# Patient Record
Sex: Female | Born: 1995
Health system: Southern US, Community
[De-identification: ages and names within clinical notes are randomized; demographics above are authoritative.]

## PROBLEM LIST (undated history)

## (undated) DIAGNOSIS — T7840XA Allergy, unspecified, initial encounter: Secondary | ICD-10-CM

## (undated) DIAGNOSIS — F988 Other specified behavioral and emotional disorders with onset usually occurring in childhood and adolescence: Secondary | ICD-10-CM

## (undated) DIAGNOSIS — J982 Interstitial emphysema: Secondary | ICD-10-CM

## (undated) DIAGNOSIS — J45909 Unspecified asthma, uncomplicated: Secondary | ICD-10-CM

## (undated) HISTORY — DX: Allergy, unspecified, initial encounter: T78.40XA

## (undated) HISTORY — DX: Interstitial emphysema: J98.2

## (undated) HISTORY — PX: TONSILLECTOMY: SUR1361

## (undated) HISTORY — DX: Other specified behavioral and emotional disorders with onset usually occurring in childhood and adolescence: F98.8

---
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
## 2015-02-09 ENCOUNTER — Telehealth: Payer: Self-pay | Admitting: Behavioral Health

## 2015-02-09 NOTE — Telephone Encounter (Signed)
Reached the patient for Pre-Visit Call, but then got disconnected. Attempted the number again and the phone line went to voicemail. Left message for patient to return call when available.

## 2015-02-10 ENCOUNTER — Other Ambulatory Visit: Payer: Self-pay | Admitting: Family

## 2015-02-10 ENCOUNTER — Encounter: Payer: Self-pay | Admitting: Family

## 2015-02-10 ENCOUNTER — Other Ambulatory Visit (HOSPITAL_COMMUNITY)
Admission: RE | Admit: 2015-02-10 | Discharge: 2015-02-10 | Disposition: A | Discharge status: Home / Self Care | Payer: 59 | Source: Ambulatory Visit | Attending: Family | Admitting: Family

## 2015-02-10 ENCOUNTER — Ambulatory Visit (HOSPITAL_BASED_OUTPATIENT_CLINIC_OR_DEPARTMENT_OTHER)
Admission: RE | Admit: 2015-02-10 | Discharge: 2015-02-10 | Disposition: A | Discharge status: Home / Self Care | Payer: 59 | Source: Ambulatory Visit | Attending: Family | Admitting: Family

## 2015-02-10 ENCOUNTER — Ambulatory Visit (INDEPENDENT_AMBULATORY_CARE_PROVIDER_SITE_OTHER): Payer: 59 | Admitting: Family

## 2015-02-10 VITALS — BP 123/67 | HR 95 | Temp 98.5°F | Resp 16 | Ht 67.25 in | Wt 127.6 lb

## 2015-02-10 DIAGNOSIS — F988 Other specified behavioral and emotional disorders with onset usually occurring in childhood and adolescence: Secondary | ICD-10-CM | POA: Insufficient documentation

## 2015-02-10 DIAGNOSIS — M954 Acquired deformity of chest and rib: Secondary | ICD-10-CM

## 2015-02-10 DIAGNOSIS — J45909 Unspecified asthma, uncomplicated: Secondary | ICD-10-CM | POA: Insufficient documentation

## 2015-02-10 DIAGNOSIS — M4185 Other forms of scoliosis, thoracolumbar region: Secondary | ICD-10-CM | POA: Diagnosis not present

## 2015-02-10 DIAGNOSIS — N760 Acute vaginitis: Secondary | ICD-10-CM | POA: Insufficient documentation

## 2015-02-10 DIAGNOSIS — J309 Allergic rhinitis, unspecified: Secondary | ICD-10-CM | POA: Insufficient documentation

## 2015-02-10 DIAGNOSIS — J4530 Mild persistent asthma, uncomplicated: Secondary | ICD-10-CM | POA: Diagnosis not present

## 2015-02-10 DIAGNOSIS — N898 Other specified noninflammatory disorders of vagina: Secondary | ICD-10-CM | POA: Diagnosis not present

## 2015-02-10 DIAGNOSIS — M419 Scoliosis, unspecified: Secondary | ICD-10-CM | POA: Insufficient documentation

## 2015-02-10 DIAGNOSIS — F909 Attention-deficit hyperactivity disorder, unspecified type: Secondary | ICD-10-CM

## 2015-02-10 DIAGNOSIS — Z113 Encounter for screening for infections with a predominantly sexual mode of transmission: Secondary | ICD-10-CM | POA: Insufficient documentation

## 2015-02-10 MED ORDER — ALBUTEROL SULFATE HFA 108 (90 BASE) MCG/ACT IN AERS: 2.0000 | INHALATION_SPRAY | Freq: Four times a day (QID) | RESPIRATORY_TRACT | Status: AC | PRN

## 2015-02-10 NOTE — Assessment & Plan Note (Signed)
X ray was performed today of her rib cage. Ribs appear normal. No breast masses noted.  Suspect that the prominent rib is due to asymmetry from her scoliosis noted today on x ray.

## 2015-02-10 NOTE — Progress Notes (Signed)
Subjective:    Patient ID: Sara Klein, female    DOB: [birth year], 19 y.o.   MRN: [id number]  HPI  Sara Klein is a 19 yr old female who presents today to establish care.    Pmhx is significant for:  ADD- This is prescribed by Psychiatry- Dr. Emmit Pomfret, started medications her senior year of high school. Reports that this is working well for her.    Asthma- Using Advair, reports well controlled not needing rescue inhaler often.  Seasonal allergies- worst in the fall.  Not using antihistamine.   She has the following concerns today:  1) sore spot on chest x 2 months. Denies injury.    2) Vaginal D/c-  Reports milky vaginal D/c with odor x 2 days.denies itching.  + sexually active.  Uses condoms She has never had a pap smear before.  Declines birth control.    Review of Systems  Constitutional: Negative for unexpected weight change.  HENT: Positive for rhinorrhea. Negative for hearing loss.   Eyes: Negative for visual disturbance.  Respiratory:       Reports "chest cold"  Cardiovascular: Negative for leg swelling.  Gastrointestinal: Negative for diarrhea and constipation.  Genitourinary: Negative for dysuria, frequency and menstrual problem.  Musculoskeletal: Negative for joint swelling and arthralgias.  Skin: Negative for rash.  Neurological: Negative for headaches.  Hematological: Negative for adenopathy.  Psychiatric/Behavioral:       Denies depression/anxiety   Past Medical History  Diagnosis Date  . ADD (attention deficit disorder)   . Allergy     seasonal  . Asthma   . Pneumomediastinum (HCC) 2013    Social History   Social History  . Marital Status: Single    Spouse Name: N/A  . Number of Children: N/A  . Years of Education: N/A   Occupational History  . Not on file.   Social History Main Topics  . Smoking status: Current Every Day Smoker -- 0.50 packs/day for 2 years    Types: Cigarettes  . Smokeless tobacco: Not on file  . Alcohol Use: 0.0  oz/week    0 Standard drinks or equivalent per week     Comment: rarely  . Drug Use: Yes     Comment: rarely  . Sexual Activity: Yes    Birth Control/ Protection: None   Other Topics Concern  . Not on file   Social History Narrative   Student at [employer] with mom   Has a younger brother.      Past Surgical History  Procedure Laterality Date  . Tonsillectomy      Family History  Problem Relation Age of Onset  . Gestational diabetes Mother   . Heart attack Father 45    deceased from drug related issues  . Cancer Maternal Aunt     breast  . Kidney disease Neg Hx     Allergies  Allergen Reactions  . Peanuts [Peanut Oil]     "sneezes"    Current Outpatient Prescriptions on File Prior to Visit  Medication Sig Dispense Refill  . Fluticasone-Salmeterol (ADVAIR) 100-50 MCG/DOSE AEPB Inhale 1 puff into the lungs every 12 (twelve) hours.    Marland Kitchen loratadine (CLARITIN) 10 MG tablet Take 10 mg by mouth daily as needed for allergies.      No current facility-administered medications on file prior to visit.    BP 123/67 mmHg  Pulse 95  Temp(Src) 98.5 F (36.9 C) (Oral)  Resp 16  Ht 5' 7.25" (1.708  m)  Wt 127 lb 9.6 oz (57.879 kg)  BMI 19.84 kg/m2  SpO2 100%  LMP 01/10/2015       Objective:   Physical Exam  Constitutional: She is oriented to person, place, and time. She appears well-developed and well-nourished.  HENT:  Head: Normocephalic and atraumatic.  Eyes: No scleral icterus.  Cardiovascular: Normal rate, regular rhythm and normal heart sounds.   No murmur heard. Pulmonary/Chest: Effort normal and breath sounds normal. No respiratory distress. She has no wheezes.  Genitourinary:  Breasts: Examined lying and sitting.  Right: Without masses, retractions, discharge or axillary adenopathy.  Left: Without masses, retractions, discharge or axillary adenopathy.  Inguinal/mons: Normal without inguinal adenopathy  External genitalia: Normal  BUS/Urethra/Skene's  glands: Normal  Bladder: Normal  Vagina: Normal  Cervix: Normal  Uterus: normal in size, shape and contour. Midline and mobile   Musculoskeletal:  Prominent rib noted on left anterior chest beneath clavicle  Lymphadenopathy:    She has no cervical adenopathy.  Neurological: She is alert and oriented to person, place, and time.  Skin: Skin is dry.  Psychiatric: She has a normal mood and affect. Her behavior is normal. Judgment and thought content normal.          Assessment & Plan:  Vaginal discharge- attempted Pap today, but pt was too uncomfortable with speculum exam.  Did obtain specimen to send off GC/Chlamydia and wet prep for further evaluation. Advised pt that when she is ready to try to repeat pap exam, she will need referral to GYN.

## 2015-02-10 NOTE — Assessment & Plan Note (Signed)
Uncontrolled. Restart claririn.

## 2015-02-10 NOTE — Assessment & Plan Note (Signed)
Stable on vyvanse.  Managed by psychiatry.

## 2015-02-10 NOTE — Assessment & Plan Note (Signed)
Stable on advair, continue same + prn albuterol.

## 2015-02-10 NOTE — Progress Notes (Signed)
Pre visit review using our clinic review tool, if applicable. No additional management support is needed unless otherwise documented below in the visit note. 

## 2015-02-10 NOTE — Patient Instructions (Addendum)
We will contact you with your results.   Add claritin 10mg  once daily. Schedule a complete physical at the front desk.  Welcome to [facility]!

## 2015-02-11 ENCOUNTER — Telehealth: Payer: Self-pay | Admitting: Family

## 2015-02-11 LAB — CERVICOVAGINAL ANCILLARY ONLY
Chlamydia: NEGATIVE
Neisseria Gonorrhea: NEGATIVE
WET PREP (BD AFFIRM): POSITIVE — AB

## 2015-02-11 MED ORDER — METRONIDAZOLE 500 MG PO TABS: 500.0000 mg | ORAL_TABLET | Freq: Two times a day (BID) | ORAL | Status: AC

## 2015-02-11 NOTE — Telephone Encounter (Signed)
Please let pt know that her wet prep is positive for garderella (BV). She should start metronidazole bid x 7 days, avoid alcohol while taking metronidazole.

## 2015-02-11 NOTE — Telephone Encounter (Signed)
Notified pt and she voices understanding. 

## 2015-03-18 ENCOUNTER — Ambulatory Visit (INDEPENDENT_AMBULATORY_CARE_PROVIDER_SITE_OTHER): Payer: 59 | Admitting: Family

## 2015-03-18 ENCOUNTER — Encounter: Payer: Self-pay | Admitting: Family

## 2015-03-18 VITALS — BP 125/69 | HR 84 | Temp 98.3°F | Resp 16 | Ht 67.25 in | Wt 128.6 lb

## 2015-03-18 DIAGNOSIS — Z0000 Encounter for general adult medical examination without abnormal findings: Secondary | ICD-10-CM

## 2015-03-18 LAB — CBC WITH DIFFERENTIAL/PLATELET
Basophils Absolute: 0 10*3/uL (ref 0.0–0.1)
Basophils Relative: 0.3 % (ref 0.0–3.0)
EOS PCT: 5 % (ref 0.0–5.0)
Eosinophils Absolute: 0.4 10*3/uL (ref 0.0–0.7)
HCT: 39.6 % (ref 36.0–46.0)
Hemoglobin: 13.5 g/dL (ref 12.0–15.0)
LYMPHS ABS: 0.9 10*3/uL (ref 0.7–4.0)
Lymphocytes Relative: 11.9 % — ABNORMAL LOW (ref 12.0–46.0)
MCHC: 34.1 g/dL (ref 30.0–36.0)
MCV: 90.3 fl (ref 78.0–100.0)
MONO ABS: 0.4 10*3/uL (ref 0.1–1.0)
Monocytes Relative: 5.7 % (ref 3.0–12.0)
NEUTROS ABS: 6.1 10*3/uL (ref 1.4–7.7)
NEUTROS PCT: 77.1 % — AB (ref 43.0–77.0)
PLATELETS: 286 10*3/uL (ref 150.0–400.0)
RBC: 4.39 Mil/uL (ref 3.87–5.11)
RDW: 12.9 % (ref 11.5–15.5)
WBC: 7.9 10*3/uL (ref 4.0–10.5)

## 2015-03-18 LAB — BASIC METABOLIC PANEL
BUN: 12 mg/dL (ref 6–23)
CO2: 25 meq/L (ref 19–32)
Calcium: 9.5 mg/dL (ref 8.4–10.5)
Chloride: 106 mEq/L (ref 96–112)
Creatinine, Ser: 0.7 mg/dL (ref 0.40–1.20)
GFR: 111.84 mL/min (ref >60.00)
GLUCOSE: 86 mg/dL (ref 70–99)
POTASSIUM: 3.9 meq/L (ref 3.5–5.1)
Sodium: 139 mEq/L (ref 135–145)

## 2015-03-18 LAB — LIPID PANEL
CHOL/HDL RATIO: 3
Cholesterol: 159 mg/dL (ref 0–200)
HDL: 60.6 mg/dL (ref >39.00)
LDL Cholesterol: 87 mg/dL (ref 0–99)
NONHDL: 97.95
Triglycerides: 57 mg/dL (ref 0.0–149.0)
VLDL: 11.4 mg/dL (ref 0.0–40.0)

## 2015-03-18 LAB — HEPATIC FUNCTION PANEL
ALBUMIN: 4.7 g/dL (ref 3.5–5.2)
ALT: 14 U/L (ref 0–35)
AST: 15 U/L (ref 0–37)
Alkaline Phosphatase: 47 U/L (ref 39–117)
BILIRUBIN TOTAL: 0.4 mg/dL (ref 0.2–1.2)
Bilirubin, Direct: 0.1 mg/dL (ref 0.0–0.3)
Total Protein: 7.2 g/dL (ref 6.0–8.3)

## 2015-03-18 LAB — URINALYSIS, ROUTINE W REFLEX MICROSCOPIC
Bilirubin Urine: NEGATIVE
KETONES UR: NEGATIVE
LEUKOCYTES UA: NEGATIVE
NITRITE: NEGATIVE
Specific Gravity, Urine: <=1.005 — AB (ref 1.000–1.030)
TOTAL PROTEIN, URINE-UPE24: NEGATIVE
URINE GLUCOSE: NEGATIVE
UROBILINOGEN UA: 0.2 (ref 0.0–1.0)
WBC UA: NONE SEEN (ref 0–?)
pH: 5.5 (ref 5.0–8.0)

## 2015-03-18 LAB — TSH: TSH: 0.79 u[IU]/mL (ref 0.35–4.50)

## 2015-03-18 NOTE — Progress Notes (Signed)
Subjective:    Patient ID: Sara Klein, female    DOB: 07-04-1995, 19 y.o.   MRN: [id number]  HPI  Patient presents today for complete physical.  Immunizations: declines flu shot Diet: reports diet is fair Exercise: some Pap Smear: would like referral Dental:  Due Eye: due  Review of Systems  Constitutional: Negative for unexpected weight change.  HENT: Negative for hearing loss.   Eyes: Negative for visual disturbance.  Respiratory: Negative for cough.   Cardiovascular: Negative for leg swelling.  Gastrointestinal: Negative for nausea, diarrhea and constipation.  Genitourinary: Negative for dysuria, frequency and menstrual problem.  Musculoskeletal: Negative for myalgias and arthralgias.       Mild knee pain-    Skin: Negative for rash.  Neurological: Negative for headaches.  Hematological: Negative for adenopathy.  Psychiatric/Behavioral:       Denies depression/anxiety   Past Medical History  Diagnosis Date  . ADD (attention deficit disorder)   . Allergy     seasonal  . Asthma   . Pneumomediastinum (HCC) 2013    Social History   Social History  . Marital Status: Single    Spouse Name: N/A  . Number of Children: N/A  . Years of Education: N/A   Occupational History  . Not on file.   Social History Main Topics  . Smoking status: Current Every Day Smoker -- 0.50 packs/day for 2 years    Types: Cigarettes  . Smokeless tobacco: Not on file  . Alcohol Use: 0.0 oz/week    0 Standard drinks or equivalent per week     Comment: rarely  . Drug Use: Yes     Comment: rarely  . Sexual Activity: Yes    Birth Control/ Protection: None   Other Topics Concern  . Not on file   Social History Narrative   Student at [employer]   Lives with mom   Has a younger brother.      Past Surgical History  Procedure Laterality Date  . Tonsillectomy      Family History  Problem Relation Age of Onset  . Gestational diabetes Mother   . Heart attack Father 45   deceased from drug related issues  . Cancer Maternal Aunt     breast  . Kidney disease Neg Hx     Allergies  Allergen Reactions  . Peanuts [Peanut Oil]     "sneezes"    Current Outpatient Prescriptions on File Prior to Visit  Medication Sig Dispense Refill  . albuterol (PROVENTIL HFA;VENTOLIN HFA) 108 (90 BASE) MCG/ACT inhaler Inhale 2 puffs into the lungs every 6 (six) hours as needed for wheezing or shortness of breath. 1 Inhaler 0  . Fluticasone-Salmeterol (ADVAIR) 100-50 MCG/DOSE AEPB Inhale 1 puff into the lungs every 12 (twelve) hours.    Marland Kitchen. lisdexamfetamine (VYVANSE) 30 MG capsule Take 30 mg by mouth daily.    Marland Kitchen. loratadine (CLARITIN) 10 MG tablet Take 10 mg by mouth daily as needed for allergies.      No current facility-administered medications on file prior to visit.    BP 125/69 mmHg  Pulse 84  Temp(Src) 98.3 F (36.8 C) (Oral)  Resp 16  Ht 5' 7.25" (1.708 m)  Wt 128 lb 9.6 oz (58.333 kg)  BMI 20.00 kg/m2  SpO2 100%  LMP 03/18/2015       Objective:   Physical Exam   Physical Exam  Constitutional: She is oriented to person, place, and time. She appears well-developed and well-nourished. No distress.  HENT:  Head: Normocephalic and atraumatic.  Right Ear: Tympanic membrane and ear canal normal.  Left Ear: Tympanic membrane and ear canal normal.  Mouth/Throat: Oropharynx is clear and moist.  Eyes: Pupils are equal, round, and reactive to light. No scleral icterus.  Neck: Normal range of motion. No thyromegaly present.  Cardiovascular: Normal rate and regular rhythm.   No murmur heard. Pulmonary/Chest: Effort normal and breath sounds normal. No respiratory distress. He has no wheezes. She has no rales. She exhibits no tenderness.  Abdominal: Soft. Bowel sounds are normal. He exhibits no distension and no mass. There is no tenderness. There is no rebound and no guarding.  Musculoskeletal: She exhibits no edema.  Lymphadenopathy:    She has no cervical  adenopathy.  Neurological: She is alert and oriented to person, place, and time. She has normal reflexes. She exhibits normal muscle tone. Coordination normal.  Skin: Skin is warm and dry.  Psychiatric: She has a normal mood and affect. Her behavior is normal. Judgment and thought content normal.  Breast/pelvic: deferred to GYN       Assessment & Plan:        Assessment & Plan:

## 2015-03-18 NOTE — Progress Notes (Signed)
Pre visit review using our clinic review tool, if applicable. No additional management support is needed unless otherwise documented below in the visit note. 

## 2015-03-18 NOTE — Patient Instructions (Signed)
Please complete lab work prior to leaving.   Continue to work on healthy diet and exercise.  

## 2015-03-19 ENCOUNTER — Encounter: Payer: Self-pay | Admitting: Family

## 2015-03-19 DIAGNOSIS — Z0000 Encounter for general adult medical examination without abnormal findings: Secondary | ICD-10-CM | POA: Insufficient documentation

## 2015-03-19 NOTE — Assessment & Plan Note (Signed)
Discussed healthy diet, exercise.  Refer to GYN for pap. Advised pt to schedule routine dental care and eye exam. Obtain routine lab work.  Call if knee pain worsens or does not improve.

## 2015-05-01 MED FILL — ADVAIR 100/50 DISKUS: 100-50 | 30 days supply | Qty: 60 | Fill #2

## 2015-05-07 MED FILL — VYVANSE 30 MG CAPSULE: 30 | 30 days supply | Qty: 30 | Fill #0

## 2015-06-01 MED FILL — ADVAIR 100/50 DISKUS: 100-50 | 30 days supply | Qty: 60 | Fill #3

## 2015-06-08 ENCOUNTER — Telehealth: Payer: Self-pay | Admitting: Family

## 2015-06-08 MED FILL — VYVANSE 30 MG CAPSULE: 30 | 30 days supply | Qty: 30 | Fill #0

## 2015-06-08 NOTE — Telephone Encounter (Signed)
PATIENT IS A [employer] EMPLOYEE AND SHE HAD HER SHOT IN September

## 2015-06-08 NOTE — Telephone Encounter (Signed)
Health maintenance has been updated.

## 2015-07-02 MED FILL — ADVAIR 100/50 DISKUS: 100-50 | 30 days supply | Qty: 60 | Fill #4

## 2015-07-08 DIAGNOSIS — F900 Attention-deficit hyperactivity disorder, predominantly inattentive type: Secondary | ICD-10-CM | POA: Diagnosis not present

## 2015-07-09 MED FILL — VYVANSE 40 MG CAPSULE: 40 | 30 days supply | Qty: 30 | Fill #0

## 2015-08-03 MED FILL — ADVAIR 100/50 DISKUS: 100-50 | 30 days supply | Qty: 60 | Fill #0

## 2015-08-07 MED FILL — VYVANSE 40 MG CAPSULE: 40 | 30 days supply | Qty: 30 | Fill #0

## 2015-09-02 MED FILL — ADVAIR 100/50 DISKUS: 100-50 | 30 days supply | Qty: 60 | Fill #1

## 2015-09-22 MED FILL — ADVAIR 100/50 DISKUS: 100-50 | 30 days supply | Qty: 60 | Fill #0

## 2015-10-26 MED FILL — VYVANSE 40 MG CAPSULE: 40 | 30 days supply | Qty: 30 | Fill #0

## 2015-11-02 MED FILL — ADVAIR 100/50 DISKUS: 100-50 | 30 days supply | Qty: 60 | Fill #1

## 2015-11-26 MED FILL — VYVANSE 30 MG CAPSULE: 30 | 30 days supply | Qty: 30 | Fill #0

## 2015-12-03 MED FILL — ADVAIR 100/50 DISKUS: 100-50 | 30 days supply | Qty: 60 | Fill #2

## 2015-12-25 MED FILL — VYVANSE 30 MG CAPSULE: 30 | 30 days supply | Qty: 30 | Fill #0

## 2016-01-04 MED FILL — ADVAIR 100/50 DISKUS: 100-50 | 30 days supply | Qty: 60 | Fill #3

## 2016-01-27 MED FILL — VYVANSE 30 MG CAPSULE: 30 | 30 days supply | Qty: 30 | Fill #0

## 2016-01-28 ENCOUNTER — Ambulatory Visit (INDEPENDENT_AMBULATORY_CARE_PROVIDER_SITE_OTHER): Payer: 59 | Admitting: Medical

## 2016-01-28 ENCOUNTER — Encounter: Payer: Self-pay | Admitting: Medical

## 2016-01-28 ENCOUNTER — Other Ambulatory Visit (HOSPITAL_COMMUNITY)
Admission: RE | Admit: 2016-01-28 | Discharge: 2016-01-28 | Disposition: A | Discharge status: Home / Self Care | Payer: 59 | Source: Ambulatory Visit | Attending: Medical | Admitting: Medical

## 2016-01-28 VITALS — BP 114/74 | HR 64 | Temp 98.0°F | Ht 67.25 in | Wt 131.4 lb

## 2016-01-28 DIAGNOSIS — L298 Other pruritus: Secondary | ICD-10-CM

## 2016-01-28 DIAGNOSIS — N898 Other specified noninflammatory disorders of vagina: Secondary | ICD-10-CM | POA: Diagnosis not present

## 2016-01-28 DIAGNOSIS — Z113 Encounter for screening for infections with a predominantly sexual mode of transmission: Secondary | ICD-10-CM | POA: Diagnosis not present

## 2016-01-28 DIAGNOSIS — N760 Acute vaginitis: Secondary | ICD-10-CM | POA: Diagnosis not present

## 2016-01-28 LAB — POC URINALSYSI DIPSTICK (AUTOMATED)
Bilirubin, UA: NEGATIVE
GLUCOSE UA: NEGATIVE
Ketones, UA: NEGATIVE
LEUKOCYTES UA: NEGATIVE
NITRITE UA: NEGATIVE
PH UA: 6
Protein, UA: NEGATIVE
Spec Grav, UA: 1.015
UROBILINOGEN UA: 0.2

## 2016-01-28 MED ORDER — FLUCONAZOLE 150 MG PO TABS: ORAL_TABLET | ORAL | 0 refills | Status: DC

## 2016-01-28 MED FILL — FLUCONAZOLE 150 MG TABLET: 150 | 1 days supply | Qty: 1 | Fill #0

## 2016-01-28 NOTE — Patient Instructions (Signed)
Your symptoms of vaginal itch and white discharge recently appear most consistent with yeast infection.   I am writing rx of diflucan with refill if needed pending the urine culture and ancillary studies(which will include bacterial vaginosis).  Follow up in 7 days or as needed

## 2016-01-28 NOTE — Progress Notes (Signed)
Pre visit review using our clinic review tool, if applicable. No additional management support is needed unless otherwise documented below in the visit note. 

## 2016-01-28 NOTE — Progress Notes (Signed)
Subjective:    Patient ID: Sara Klein, female    DOB: 10-26-1995, 20 y.o.   MRN: [id number]  HPI  Pt in for what she states is likely yeast infection.  Pt states on Saturday she had symptoms. .She stated itching discomfort and vaginal itch. She had whitish discharge. She states Saturday  used otc monistat or equivalent and  states she felt better. Then this morning some faint itching again. No vaginal odor reported.  No recent antibiotics. Pt states hx of occasional yeast infection. But also states hx of bv one time.   LMP- October 5th, 2017. Normal cycle and came when she expected it to.  No burning or frequent urination. See GU ROS.   Review of Systems  Constitutional: Negative for chills, fatigue and fever.  HENT: Negative for congestion.   Respiratory: Negative for cough, chest tightness, shortness of breath and wheezing.   Cardiovascular: Negative for chest pain and palpitations.  Gastrointestinal: Negative for abdominal pain, blood in stool, constipation, nausea and vomiting.  Genitourinary: Negative for difficulty urinating, dysuria, frequency, pelvic pain, urgency, vaginal bleeding, vaginal discharge and vaginal pain.       Vaginal itch. White dc. See hpi.  Musculoskeletal: Negative for back pain.  Psychiatric/Behavioral: Negative for behavioral problems and confusion.   Past Medical History:  Diagnosis Date  . ADD (attention deficit disorder)   . Allergy    seasonal  . Asthma   . Pneumomediastinum ([facility]) 2013     Social History   Social History  . Marital status: Single    Spouse name: N/A  . Number of children: N/A  . Years of education: N/A   Occupational History  . Not on file.   Social History Main Topics  . Smoking status: Current Every Day Smoker    Packs/day: 0.50    Years: 2.00    Types: Cigarettes  . Smokeless tobacco: Not on file  . Alcohol use 0.0 oz/week     Comment: rarely  . Drug use:      Comment: rarely  . Sexual activity: Yes      Birth control/ protection: None   Other Topics Concern  . Not on file   Social History Narrative   Student at [employer]   Lives with mom   Has a younger brother.      Past Surgical History:  Procedure Laterality Date  . TONSILLECTOMY      Family History  Problem Relation Age of Onset  . Gestational diabetes Mother   . Heart attack Father 45    deceased from drug related issues  . Cancer Maternal Aunt     breast  . Kidney disease Neg Hx     Allergies  Allergen Reactions  . Peanuts [Peanut Oil]     "sneezes"    Current Outpatient Prescriptions on File Prior to Visit  Medication Sig Dispense Refill  . albuterol (PROVENTIL HFA;VENTOLIN HFA) 108 (90 BASE) MCG/ACT inhaler Inhale 2 puffs into the lungs every 6 (six) hours as needed for wheezing or shortness of breath. 1 Inhaler 0  . Fluticasone-Salmeterol (ADVAIR) 100-50 MCG/DOSE AEPB Inhale 1 puff into the lungs every 12 (twelve) hours.    Marland Kitchen. lisdexamfetamine (VYVANSE) 30 MG capsule Take 30 mg by mouth daily.    Marland Kitchen. loratadine (CLARITIN) 10 MG tablet Take 10 mg by mouth daily as needed for allergies.      No current facility-administered medications on file prior to visit.     BP 114/74  Pulse 64   Temp 98 F (36.7 C) (Oral)   Ht 5' 7.25" (1.708 m)   Wt 131 lb 6.4 oz (59.6 kg)   LMP 01/16/2016   SpO2 95%   BMI 20.43 kg/m       Objective:   Physical Exam   General Appearance- Not in acute distress.  HEENT Eyes- Scleraeral/Conjuntiva-bilat- Not Yellow. Mouth & Throat- Normal.  Chest and Lung Exam Auscultation: Breath sounds:-Normal. Adventitious sounds:- No Adventitious sounds.  Cardiovascular Auscultation:Rythm - Regular. Heart Sounds -Normal heart sounds.  Abdomen Inspection:-Inspection Normal.  Palpation/Perucssion: Palpation and Percussion of the abdomen reveal- Non Tender suprapubic area, No Rebound tenderness, No rigidity(Guarding) and No Palpable abdominal masses.  Liver:-Normal.  Spleen:-  Normal.    Back -no cva tenderness.     Assessment & Plan:  Your symptoms of vaginal itch and white discharge recently appear most consistent with yeast infection.   I am writing rx of diflucan with refill if needed pending the urine culture and ancillary studies(which will include bacterial vaginosis).  Follow up in 7 days or as needed   Elzora Cullins, Ramon Dredge, [employer]

## 2016-01-29 LAB — URINE CULTURE

## 2016-02-01 LAB — URINE CYTOLOGY ANCILLARY ONLY
CHLAMYDIA, DNA PROBE: NEGATIVE
NEISSERIA GONORRHEA: NEGATIVE
Trichomonas: NEGATIVE

## 2016-02-03 LAB — URINE CYTOLOGY ANCILLARY ONLY
BACTERIAL VAGINITIS: POSITIVE — AB
CANDIDA VAGINITIS: NEGATIVE

## 2016-02-03 MED ORDER — METRONIDAZOLE 500 MG PO TABS: 500.0000 mg | ORAL_TABLET | Freq: Two times a day (BID) | ORAL | 0 refills | Status: AC

## 2016-02-03 MED FILL — ADVAIR 100/50 DISKUS: 100-50 | 30 days supply | Qty: 60 | Fill #4

## 2016-02-03 NOTE — Progress Notes (Signed)
Pt has seen results on MyChart and message also sent for patient to call back if any questions.

## 2016-02-03 NOTE — Telephone Encounter (Signed)
Rx of flagyl sent to her pharmacy. Urine study came back + for bv.

## 2016-02-04 MED FILL — metroNIDAZOLE 500 MG TABS: 500 | 7 days supply | Qty: 14 | Fill #0

## 2016-02-04 NOTE — Progress Notes (Signed)
Pt has seen results on MyChart and message also sent for patient to call back if any questions.

## 2016-02-04 NOTE — Telephone Encounter (Signed)
Pt notified and made aware.  She had a question about what medication was and how long she should take it. Pt was informed that medication is antibiotic that she is to take twice a day for 7 days.  She stated understanding and is in agreement with plan.  No additional questions at this time.

## 2016-02-04 NOTE — Telephone Encounter (Signed)
Called pt and advised to take flagyl. She expressed understanding and started it tonight.

## 2016-03-02 MED FILL — ADVAIR 100/50 DISKUS: 100-50 | 30 days supply | Qty: 60 | Fill #5

## 2016-03-09 MED FILL — VYVANSE 30 MG CAPSULE: 30 | 90 days supply | Qty: 90 | Fill #0

## 2016-03-18 DIAGNOSIS — L209 Atopic dermatitis, unspecified: Secondary | ICD-10-CM | POA: Diagnosis not present

## 2016-03-18 DIAGNOSIS — J4540 Moderate persistent asthma, uncomplicated: Secondary | ICD-10-CM | POA: Diagnosis not present

## 2016-03-18 DIAGNOSIS — J3089 Other allergic rhinitis: Secondary | ICD-10-CM | POA: Diagnosis not present

## 2016-03-18 DIAGNOSIS — J3081 Allergic rhinitis due to animal (cat) (dog) hair and dander: Secondary | ICD-10-CM | POA: Diagnosis not present

## 2016-03-18 DIAGNOSIS — J301 Allergic rhinitis due to pollen: Secondary | ICD-10-CM | POA: Diagnosis not present

## 2016-03-18 MED FILL — TRIAMCINOLONE 0.1% OINTMENT: 0.1 | 30 days supply | Qty: 60 | Fill #0

## 2016-04-08 MED FILL — ADVAIR 100/50 DISKUS: 100-50 | 30 days supply | Qty: 60 | Fill #0

## 2016-05-10 MED FILL — ADVAIR 100/50 DISKUS: 100-50 | 30 days supply | Qty: 60 | Fill #1

## 2016-06-07 MED FILL — VYVANSE 30 MG CAPSULE: 30 | 30 days supply | Qty: 30 | Fill #0

## 2016-06-09 MED FILL — ADVAIR 100/50 DISKUS: 100-50 | 30 days supply | Qty: 60 | Fill #2

## 2016-07-07 MED FILL — VYVANSE 30 MG CAPSULE: 30 | 30 days supply | Qty: 30 | Fill #0

## 2016-07-07 MED FILL — ADVAIR 100/50 DISKUS: 100-50 | 30 days supply | Qty: 60 | Fill #3

## 2016-08-08 MED FILL — VYVANSE 30 MG CAPSULE: 30 | 30 days supply | Qty: 30 | Fill #0

## 2016-08-08 MED FILL — ADVAIR 100/50 DISKUS: 100-50 | 30 days supply | Qty: 60 | Fill #4

## 2016-09-08 MED FILL — ADVAIR 100/50 DISKUS: 100-50 | 30 days supply | Qty: 60 | Fill #5

## 2016-09-13 MED FILL — VYVANSE 30 MG CAPSULE: 30 | 30 days supply | Qty: 30 | Fill #0

## 2016-09-23 IMAGING — DX DG RIBS W/ CHEST 3+V BILAT
5 series · 5 of 5 positions shown · non-contrast
Comparison: Chest x-ray of March 23, 2012

CLINICAL DATA: Mid to upper anterior rib prominence discovered
several days ago. No history of injury, no pain.

EXAM:
BILATERAL RIBS AND CHEST - 4+ VIEW

[chest pa]
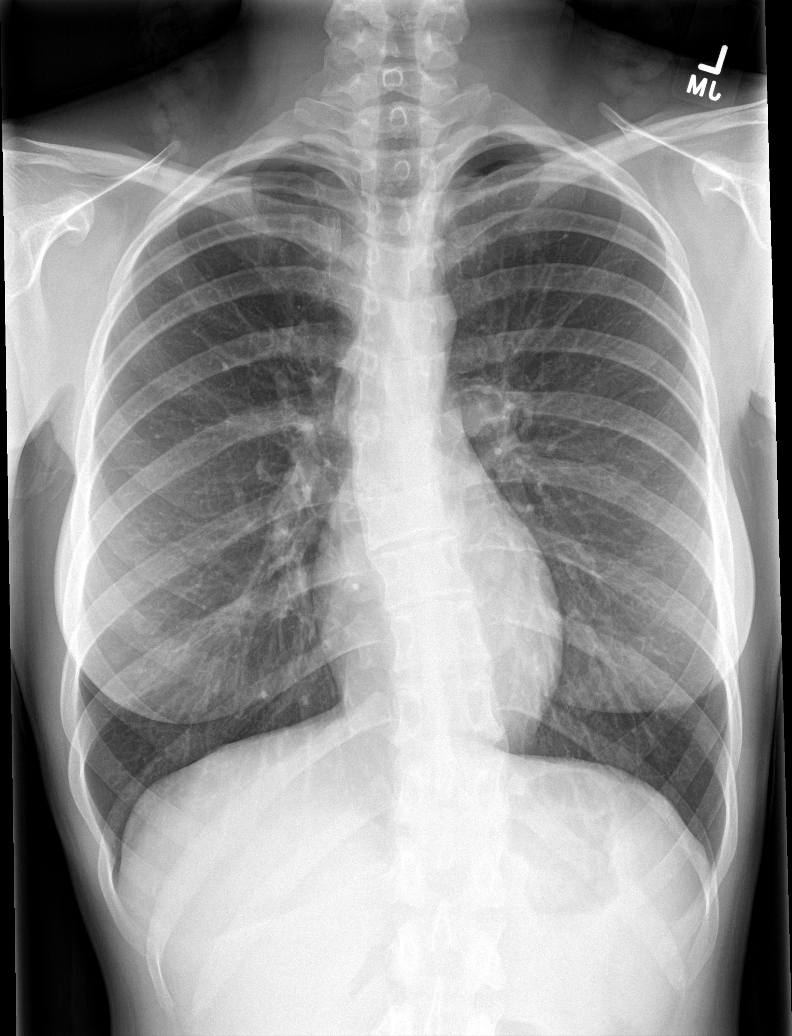

[rib pa (1 of 2)]
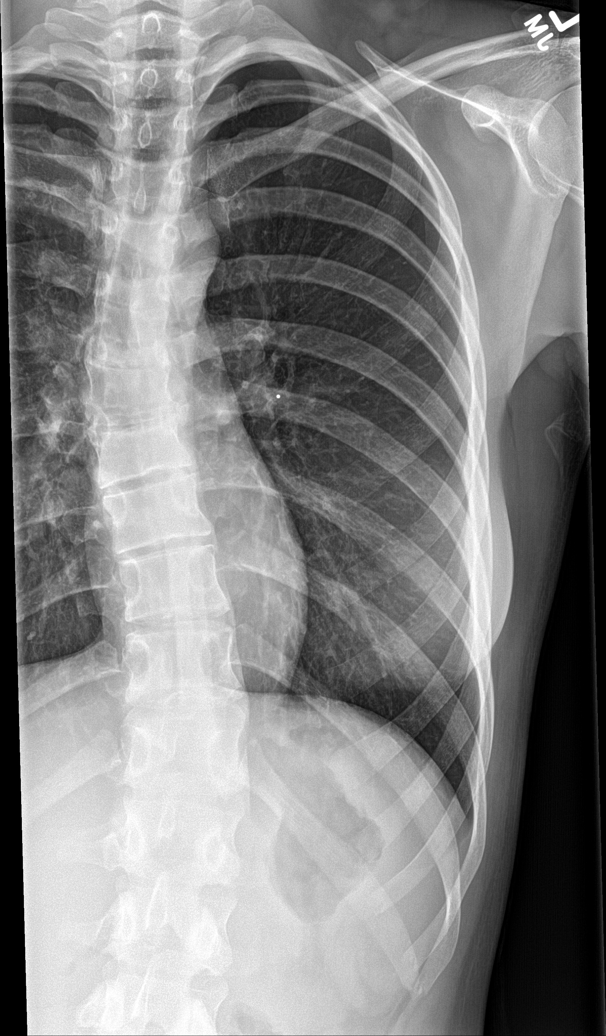

[rib pa (2 of 2)]
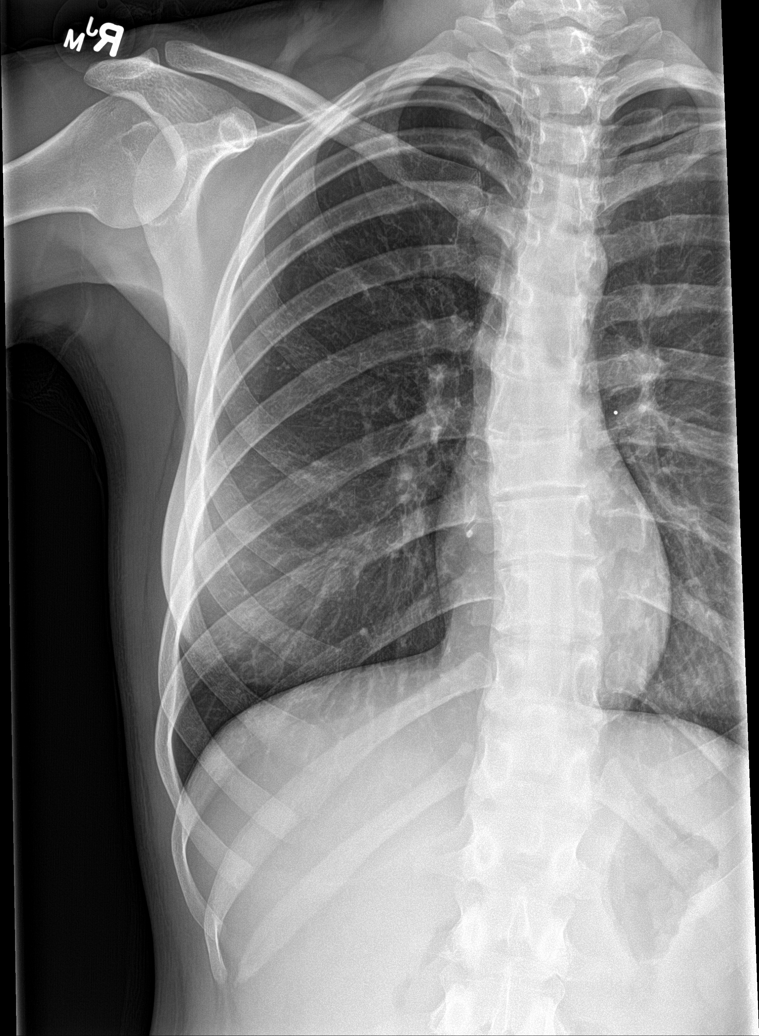

[rib pa obl (1 of 2)]
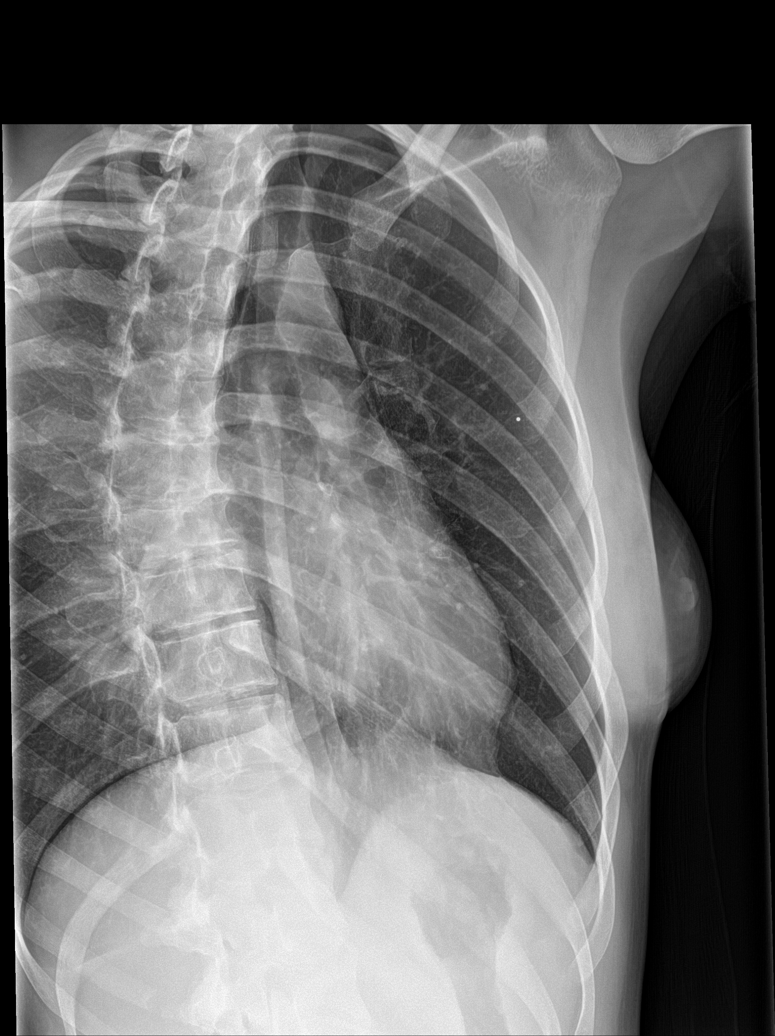

[rib pa obl (2 of 2)]
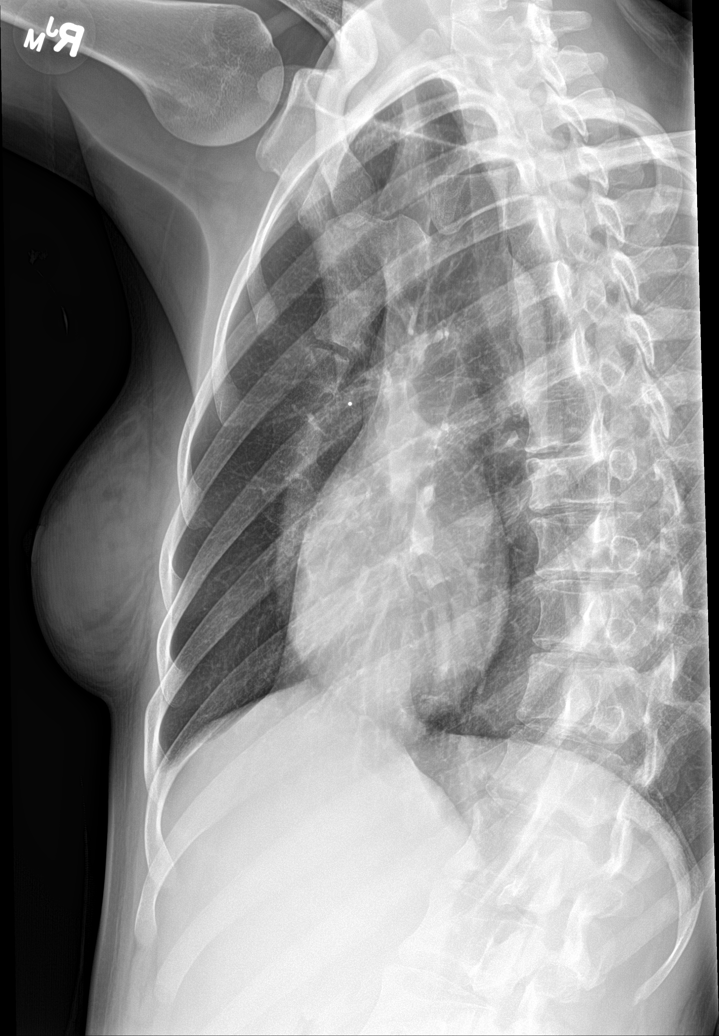

[5 of 5 positions shown; findings below may reference images not displayed]

FINDINGS: The lungs are well-expanded. There is no focal infiltrate the heart
and pulmonary vascularity are normal. The mediastinum is normal in
width. There is no pneumothorax or pneumomediastinum. There is
gentle S shaped thoracolumbar scoliosis.

Left rib images reveal the bones to be normal in contour and
adequately mineralized. No acute or healing fracture is observed.
IMPRESSION: 1. No acute abnormality of the ribs is demonstrated. There is
moderate S shaped thoracolumbar scoliosis. This is not a new finding
when compared to the previous study.
2. There is no active cardiopulmonary disease.

## 2016-10-10 MED FILL — ADVAIR 100/50 DISKUS: 100-50 | 30 days supply | Qty: 60 | Fill #0

## 2016-10-26 MED FILL — VYVANSE 30 MG CAPSULE: 30 | 30 days supply | Qty: 30 | Fill #0

## 2016-11-14 MED FILL — ADVAIR 100/50 DISKUS: 100-50 | 30 days supply | Qty: 60 | Fill #1

## 2016-11-25 MED FILL — VYVANSE 30 MG CAPSULE: 30 | 30 days supply | Qty: 30 | Fill #0

## 2016-12-19 MED FILL — ADVAIR 100/50 DISKUS: 100-50 | 30 days supply | Qty: 60 | Fill #2

## 2017-01-04 MED FILL — VYVANSE 30 MG CAPSULE: 30 | 30 days supply | Qty: 30 | Fill #0

## 2017-01-19 MED FILL — ADVAIR 100/50 DISKUS: 100-50 | 30 days supply | Qty: 60 | Fill #0

## 2017-02-09 MED FILL — VYVANSE 30 MG CAPSULE: 30 | 30 days supply | Qty: 30 | Fill #0

## 2017-02-20 MED FILL — ADVAIR 100/50 DISKUS: 100-50 | 30 days supply | Qty: 60 | Fill #0

## 2017-03-13 MED FILL — VYVANSE 30 MG CAPSULE: 30 | 30 days supply | Qty: 30 | Fill #0

## 2017-03-15 DIAGNOSIS — J4540 Moderate persistent asthma, uncomplicated: Secondary | ICD-10-CM | POA: Diagnosis not present

## 2017-03-15 DIAGNOSIS — L209 Atopic dermatitis, unspecified: Secondary | ICD-10-CM | POA: Diagnosis not present

## 2017-03-15 DIAGNOSIS — J301 Allergic rhinitis due to pollen: Secondary | ICD-10-CM | POA: Diagnosis not present

## 2017-03-15 DIAGNOSIS — J3081 Allergic rhinitis due to animal (cat) (dog) hair and dander: Secondary | ICD-10-CM | POA: Diagnosis not present

## 2017-03-15 MED FILL — VENTOLIN HFA 90 MCG INHALER: 108 (90 BAS | 16 days supply | Qty: 18 | Fill #0

## 2017-03-16 MED FILL — ADVAIR 100/50 DISKUS: 100-50 | 30 days supply | Qty: 60 | Fill #0

## 2017-04-13 MED FILL — VYVANSE 30 MG CAPSULE: 30 | 30 days supply | Qty: 30 | Fill #0

## 2017-04-24 MED FILL — ADVAIR 100/50 DISKUS: 100-50 | 30 days supply | Qty: 60 | Fill #1

## 2017-05-12 MED FILL — VYVANSE 30 MG CAPSULE: 30 | 30 days supply | Qty: 30 | Fill #0

## 2017-05-26 MED FILL — ADVAIR 100/50 DISKUS: 100-50 | 30 days supply | Qty: 60 | Fill #2

## 2017-06-15 MED FILL — VYVANSE 30 MG CAPSULE: 30 | 30 days supply | Qty: 30 | Fill #0

## 2017-06-30 MED FILL — ADVAIR 100/50 DISKUS: 100-50 | 30 days supply | Qty: 60 | Fill #3

## 2017-07-13 MED FILL — VYVANSE 30 MG CAPSULE: 30 | 30 days supply | Qty: 30 | Fill #0

## 2017-07-27 MED FILL — ADVAIR 100/50 DISKUS: 100-50 | 30 days supply | Qty: 60 | Fill #4

## 2017-08-11 MED FILL — VYVANSE 30 MG CAPSULE: 30 | 30 days supply | Qty: 30 | Fill #0

## 2017-08-30 MED FILL — ADVAIR 100/50 DISKUS: 100-50 | 30 days supply | Qty: 60 | Fill #5

## 2017-09-08 MED FILL — VYVANSE 30 MG CAPSULE: 30 | 30 days supply | Qty: 30 | Fill #0

## 2017-09-11 ENCOUNTER — Telehealth: Payer: 59 | Admitting: Family

## 2017-09-11 DIAGNOSIS — B373 Candidiasis of vulva and vagina: Secondary | ICD-10-CM | POA: Diagnosis not present

## 2017-09-11 DIAGNOSIS — B3731 Acute candidiasis of vulva and vagina: Secondary | ICD-10-CM

## 2017-09-11 MED ORDER — FLUCONAZOLE 150 MG PO TABS: 150.0000 mg | ORAL_TABLET | Freq: Once | ORAL | 0 refills | Status: AC

## 2017-09-11 MED FILL — FLUCONAZOLE 150 MG TABS: 150 | 1 days supply | Qty: 1 | Fill #0

## 2017-09-11 NOTE — Progress Notes (Signed)
Thank you for the details you included in the comment boxes. Those details are very helpful in determining the best course of treatment for you and help us to provide the best care. It is possible that there is more yeast colonized and that medication would take care of the rest. I am sending plan as below.  We are sorry that you are not feeling well. Here is how we plan to help! Based on what you shared with me it looks like you: May have a yeast vaginosis  Vaginosis is an inflammation of the vagina that can result in discharge, itching and pain. The cause is usually a change in the normal balance of vaginal bacteria or an infection. Vaginosis can also result from reduced estrogen levels after menopause.  The most common causes of vaginosis are:   Bacterial vaginosis which results from an overgrowth of one on several organisms that are normally present in your vagina.   Yeast infections which are caused by a naturally occurring fungus called candida.   Vaginal atrophy (atrophic vaginosis) which results from the thinning of the vagina from reduced estrogen levels after menopause.   Trichomoniasis which is caused by a parasite and is commonly transmitted by sexual intercourse.  Factors that increase your risk of developing vaginosis include: Marland Kitchen. Medications, such as antibiotics and steroids . Uncontrolled diabetes . Use of hygiene products such as bubble bath, vaginal spray or vaginal deodorant . Douching . Wearing damp or tight-fitting clothing . Using an intrauterine device (IUD) for birth control . Hormonal changes, such as those associated with pregnancy, birth control pills or menopause . Sexual activity . Having a sexually transmitted infection  Your treatment plan is A single Diflucan (fluconazole) 150mg  tablet once.  I have electronically sent this prescription into the pharmacy that you have chosen.  Be sure to take all of the medication as directed. Stop taking any medication if  you develop a rash, tongue swelling or shortness of breath. Mothers who are breast feeding should consider pumping and discarding their breast milk while on these antibiotics. However, there is no consensus that infant exposure at these doses would be harmful.  Remember that medication creams can weaken latex condoms. Marland Kitchen.   HOME CARE:  Good hygiene may prevent some types of vaginosis from recurring and may relieve some symptoms:  . Avoid baths, hot tubs and whirlpool spas. Rinse soap from your outer genital area after a shower, and dry the area well to prevent irritation. Don't use scented or harsh soaps, such as those with deodorant or antibacterial action. Marland Kitchen. Avoid irritants. These include scented tampons and pads. . Wipe from front to back after using the toilet. Doing so avoids spreading fecal bacteria to your vagina.  Other things that may help prevent vaginosis include:  Marland Kitchen. Don't douche. Your vagina doesn't require cleansing other than normal bathing. Repetitive douching disrupts the normal organisms that reside in the vagina and can actually increase your risk of vaginal infection. Douching won't clear up a vaginal infection. . Use a latex condom. Both female and female latex condoms may help you avoid infections spread by sexual contact. . Wear cotton underwear. Also wear pantyhose with a cotton crotch. If you feel comfortable without it, skip wearing underwear to bed. Yeast thrives in [employer] Hot[employer]moist environments Your symptoms should improve in the next day or two.  GET HELP RIGHT AWAY IF:  . You have pain in your lower abdomen ( pelvic area or over your ovaries) . You develop nausea  or vomiting . You develop a fever . Your discharge changes or worsens . You have persistent pain with intercourse . You develop shortness of breath, a rapid pulse, or you faint.  These symptoms could be signs of problems or infections that need to be evaluated by a medical provider now.  MAKE SURE YOU     Understand these instructions.  Will watch your condition.  Will get help right away if you are not doing well or get worse.  Your e-visit answers were reviewed by a board certified advanced clinical practitioner to complete your personal care plan. Depending upon the condition, your plan could have included both over the counter or prescription medications. Please review your pharmacy choice to make sure that you have choses a pharmacy that is open for you to pick up any needed prescription, Your safety is important to [place]. If you have drug allergies check your prescription carefully.   You can use MyChart to ask questions about today's visit, request a non-urgent call back, or ask for a work or school excuse for 24 hours related to this e-Visit. If it has been greater than 24 hours you will need to follow up with your provider, or enter a new e-Visit to address those concerns. You will get a MyChart message within the next two days asking about your experience. I hope that your e-visit has been valuable and will speed your recovery.

## 2017-10-02 MED FILL — ADVAIR 100/50 DISKUS: 100-50 | 30 days supply | Qty: 60 | Fill #0

## 2017-10-11 MED FILL — VYVANSE 30 MG CAPSULE: 30 | 30 days supply | Qty: 30 | Fill #0

## 2017-10-31 MED FILL — ADVAIR 100/50 DISKUS: 100-50 | 30 days supply | Qty: 60 | Fill #1

## 2017-11-09 MED FILL — VYVANSE 30 MG CAPSULE: 30 | 30 days supply | Qty: 30 | Fill #0

## 2017-11-30 MED FILL — ADVAIR 100/50 DISKUS: 100-50 | 30 days supply | Qty: 60 | Fill #2

## 2017-12-13 MED FILL — VYVANSE 30 MG CAPSULE: 30 | 30 days supply | Qty: 30 | Fill #0

## 2017-12-29 MED FILL — ADVAIR 100/50 DISKUS: 100-50 | 30 days supply | Qty: 60 | Fill #3

## 2018-01-11 MED FILL — VYVANSE 30 MG CAPSULE: 30 | 30 days supply | Qty: 30 | Fill #0

## 2018-01-31 MED FILL — ADVAIR 100/50 DISKUS: 100-50 | 30 days supply | Qty: 60 | Fill #4

## 2018-02-12 MED FILL — VYVANSE 30 MG CAPSULE: 30 | 30 days supply | Qty: 30 | Fill #0

## 2018-03-06 MED FILL — ADVAIR 100/50 DISKUS: 100-50 | 30 days supply | Qty: 60 | Fill #0

## 2018-03-16 MED FILL — VYVANSE 30 MG CAPSULE: 30 | 30 days supply | Qty: 30 | Fill #0

## 2018-04-09 MED FILL — ADVAIR 100/50 DISKUS: 100-50 | 30 days supply | Qty: 60 | Fill #1

## 2018-04-17 MED FILL — VYVANSE 30 MG CAPSULE: 30 | 30 days supply | Qty: 30 | Fill #0

## 2018-05-11 MED FILL — ADVAIR 100/50 DISKUS: 100-50 | 30 days supply | Qty: 60 | Fill #2

## 2018-05-18 MED FILL — VYVANSE 30 MG CAPSULE: 30 | 30 days supply | Qty: 30 | Fill #0

## 2018-06-14 MED FILL — ADVAIR 100/50 DISKUS: 100-50 | 30 days supply | Qty: 60 | Fill #3

## 2018-06-18 MED FILL — VYVANSE 30 MG CAPSULE: 30 | 30 days supply | Qty: 30 | Fill #0

## 2018-07-16 MED FILL — ADVAIR 100/50 DISKUS: 100-50 | 30 days supply | Qty: 60 | Fill #0

## 2018-07-17 MED FILL — VYVANSE 30 MG CAPSULE: 30 | 30 days supply | Qty: 30 | Fill #0

## 2018-08-16 MED FILL — ADVAIR 100/50 DISKUS: 100-50 | 30 days supply | Qty: 60 | Fill #1

## 2018-08-24 MED FILL — VYVANSE 30 MG CAPSULE: 30 | 30 days supply | Qty: 30 | Fill #0

## 2018-09-10 MED FILL — AMOXICILLIN 500 MG CAPSULE: 500 | 5 days supply | Qty: 15 | Fill #0

## 2018-09-21 MED FILL — ADVAIR 100/50 DISKUS: 100-50 | 30 days supply | Qty: 60 | Fill #2

## 2018-09-21 MED FILL — VYVANSE 30 MG CAPSULE: 30 | 30 days supply | Qty: 30 | Fill #0

## 2018-10-23 MED FILL — PROMETHAZINE 25 MG TABLET: 25 | 2 days supply | Qty: 10 | Fill #0

## 2018-10-23 MED FILL — ADVAIR 100/50 DISKUS: 100-50 | 30 days supply | Qty: 60 | Fill #3

## 2018-10-23 MED FILL — IBUPROFEN 800 MG TAB: 800 | 7 days supply | Qty: 20 | Fill #0

## 2018-10-24 MED FILL — VYVANSE 30 MG CAPSULE: 30 | 30 days supply | Qty: 30 | Fill #0

## 2018-11-23 MED FILL — ADVAIR 100/50 DISKUS: 100-50 | 30 days supply | Qty: 60 | Fill #4

## 2018-11-23 MED FILL — VYVANSE 30 MG CAPSULE: 30 | 30 days supply | Qty: 30 | Fill #0

## 2018-12-25 ENCOUNTER — Other Ambulatory Visit: Payer: Self-pay

## 2018-12-25 ENCOUNTER — Ambulatory Visit (INDEPENDENT_AMBULATORY_CARE_PROVIDER_SITE_OTHER): Payer: No Typology Code available for payment source | Admitting: Family Medicine

## 2018-12-25 ENCOUNTER — Telehealth: Payer: Self-pay | Admitting: Family Medicine

## 2018-12-25 ENCOUNTER — Encounter: Payer: Self-pay | Admitting: Family Medicine

## 2018-12-25 VITALS — BP 96/60 | HR 85 | Temp 97.8°F | Resp 18 | Ht 67.25 in | Wt 132.4 lb

## 2018-12-25 DIAGNOSIS — R002 Palpitations: Secondary | ICD-10-CM | POA: Diagnosis not present

## 2018-12-25 NOTE — Telephone Encounter (Signed)
Yes am happy to take her as patient but warn her it will be a couple of months unless she has an urgent concern. In which case I could do a virtual visit to start

## 2018-12-25 NOTE — Progress Notes (Signed)
New Patient Office Visit  Subjective:  Patient ID: Sara Klein, female    DOB: 1995-08-28  Age: 23 y.o. MRN: [id number]  CC:  Chief Complaint  Patient presents with  . Palpitations    Pt states starting last week. Pt states having 1 episode.    HPI Sara Klein presents for c/o palpitaions since last wed that was constant until today. She stopped her vyvanse , caffeine and nicotine patches after they started  No sob, no cp   Past Medical History:  Diagnosis Date  . ADD (attention deficit disorder)   . Allergy    seasonal  . Asthma   . Pneumomediastinum ([facility]) 2013    Past Surgical History:  Procedure Laterality Date  . TONSILLECTOMY      Family History  Problem Relation Age of Onset  . Gestational diabetes Mother   . Heart attack Father 26       deceased from drug related issues  . Cancer Maternal Aunt        breast  . Kidney disease Neg Hx     Social History   Socioeconomic History  . Marital status: Single    Spouse name: Not on file  . Number of children: Not on file  . Years of education: Not on file  . Highest education level: Not on file  Occupational History  . Not on file  Social Needs  . Financial resource strain: Not on file  . Food insecurity    Worry: Not on file    Inability: Not on file  . Transportation needs    Medical: Not on file    Non-medical: Not on file  Tobacco Use  . Smoking status: Current Every Day Smoker    Packs/day: 0.50    Years: 2.00    Pack years: 1.00    Types: Cigarettes  Substance and Sexual Activity  . Alcohol use: Yes    Alcohol/week: 0.0 standard drinks    Comment: rarely  . Drug use: Yes    Comment: rarely  . Sexual activity: Yes    Birth control/protection: None  Lifestyle  . Physical activity    Days per week: Not on file    Minutes per session: Not on file  . Stress: Not on file  Relationships  . Social Herbalist on phone: Not on file    Gets together: Not on file    Attends  religious service: Not on file    Active member of club or organization: Not on file    Attends meetings of clubs or organizations: Not on file    Relationship status: Not on file  . Intimate partner violence    Fear of current or ex partner: Not on file    Emotionally abused: Not on file    Physically abused: Not on file    Forced sexual activity: Not on file  Other Topics Concern  . Not on file  Social History Narrative   Student at [employer] with mom   Has a younger brother.      ROS Review of Systems  Constitutional: Negative for appetite change, diaphoresis, fatigue and unexpected weight change.  Eyes: Negative for pain, redness and visual disturbance.  Respiratory: Negative for cough, chest tightness, shortness of breath and wheezing.   Cardiovascular: Positive for palpitations. Negative for chest pain and leg swelling.  Endocrine: Negative for cold intolerance, heat intolerance, polydipsia, polyphagia and polyuria.  Genitourinary: Negative for difficulty  urinating, dysuria and frequency.  Neurological: Negative for dizziness, light-headedness, numbness and headaches.    Objective:   Today's Vitals: BP 96/60 (BP Location: Left Arm, Patient Position: Sitting, Cuff Size: Normal)   Pulse 85   Temp 97.8 F (36.6 C) (Temporal)   Resp 18   Ht 5' 7.25" (1.708 m)   Wt 132 lb 6.4 oz (60.1 kg)   SpO2 99%   BMI 20.58 kg/m   Physical Exam Vitals signs and nursing note reviewed.  Constitutional:      Appearance: She is well-developed.  HENT:     Head: Normocephalic and atraumatic.  Eyes:     Conjunctiva/sclera: Conjunctivae normal.  Neck:     Musculoskeletal: Normal range of motion and neck supple.     Thyroid: No thyromegaly.     Vascular: No carotid bruit or JVD.  Cardiovascular:     Rate and Rhythm: Normal rate and regular rhythm.     Heart sounds: Normal heart sounds. No murmur.  Pulmonary:     Effort: Pulmonary effort is normal. No respiratory distress.      Breath sounds: Normal breath sounds. No wheezing or rales.  Chest:     Chest wall: No tenderness.  Neurological:     Mental Status: She is alert and oriented to person, place, and time.     Assessment & Plan:   Problem List Items Addressed This Visit    None    Visit Diagnoses    Palpitations    -  Primary   Relevant Orders   EKG 12-Lead (Completed)   CBC with Differential/Platelet   TSH   Comprehensive metabolic panel    consider event monitor / cardiology if symptoms return   Outpatient Encounter Medications as of 12/25/2018  Medication Sig  . albuterol (PROVENTIL HFA;VENTOLIN HFA) 108 (90 BASE) MCG/ACT inhaler Inhale 2 puffs into the lungs every 6 (six) hours as needed for wheezing or shortness of breath.  . Fluticasone-Salmeterol (ADVAIR) 100-50 MCG/DOSE AEPB Inhale 1 puff into the lungs every 12 (twelve) hours.  Marland Kitchen. lisdexamfetamine (VYVANSE) 30 MG capsule Take 30 mg by mouth daily.  Marland Kitchen. loratadine (CLARITIN) 10 MG tablet Take 10 mg by mouth daily as needed for allergies.   . metroNIDAZOLE (FLAGYL) 500 MG tablet Take 1 tablet (500 mg total) by mouth 2 (two) times daily. (Patient not taking: Reported on 12/25/2018)   No facility-administered encounter medications on file as of 12/25/2018.     Follow-up: Return if symptoms worsen or fail to improve.   Donato SchultzYvonne R Lowne Chase, DO

## 2018-12-25 NOTE — Patient Instructions (Signed)

## 2018-12-25 NOTE — Telephone Encounter (Signed)
Patient's mother Maryelizabeth Eberle) spoke with Dr. Charlett Blake at her last appointment about having her daughter become a patient with Dr. Charlett Blake. Verbal approval given according to patient. Curtesy message being sent for TOC.

## 2018-12-26 LAB — CBC WITH DIFFERENTIAL/PLATELET
Basophils Absolute: 0.1 10*3/uL (ref 0.0–0.1)
Basophils Relative: 1.4 % (ref 0.0–3.0)
Eosinophils Absolute: 0.6 10*3/uL (ref 0.0–0.7)
Eosinophils Relative: 9 % — ABNORMAL HIGH (ref 0.0–5.0)
HCT: 37.1 % (ref 36.0–46.0)
Hemoglobin: 12.6 g/dL (ref 12.0–15.0)
Lymphocytes Relative: 31.7 % (ref 12.0–46.0)
Lymphs Abs: 2.1 10*3/uL (ref 0.7–4.0)
MCHC: 33.9 g/dL (ref 30.0–36.0)
MCV: 90.7 fl (ref 78.0–100.0)
Monocytes Absolute: 0.7 10*3/uL (ref 0.1–1.0)
Monocytes Relative: 11 % (ref 3.0–12.0)
Neutro Abs: 3.1 10*3/uL (ref 1.4–7.7)
Neutrophils Relative %: 46.9 % (ref 43.0–77.0)
Platelets: 256 10*3/uL (ref 150.0–400.0)
RBC: 4.09 Mil/uL (ref 3.87–5.11)
RDW: 12.3 % (ref 11.5–15.5)
WBC: 6.7 10*3/uL (ref 4.0–10.5)

## 2018-12-26 LAB — COMPREHENSIVE METABOLIC PANEL
ALT: 13 U/L (ref 0–35)
AST: 13 U/L (ref 0–37)
Albumin: 4.3 g/dL (ref 3.5–5.2)
Alkaline Phosphatase: 45 U/L (ref 39–117)
BUN: 12 mg/dL (ref 6–23)
CO2: 26 mEq/L (ref 19–32)
Calcium: 9.1 mg/dL (ref 8.4–10.5)
Chloride: 106 mEq/L (ref 96–112)
Creatinine, Ser: 0.73 mg/dL (ref 0.40–1.20)
GFR: 97 mL/min (ref >60.00)
Glucose, Bld: 77 mg/dL (ref 70–99)
Potassium: 3.8 mEq/L (ref 3.5–5.1)
Sodium: 139 mEq/L (ref 135–145)
Total Bilirubin: 0.4 mg/dL (ref 0.2–1.2)
Total Protein: 6.5 g/dL (ref 6.0–8.3)

## 2018-12-26 LAB — TSH: TSH: 1.03 u[IU]/mL (ref 0.35–4.50)

## 2018-12-26 MED FILL — ADVAIR 100/50 DISKUS: 100-50 | 30 days supply | Qty: 60 | Fill #5

## 2018-12-27 NOTE — Telephone Encounter (Signed)
LVM to schedule NPV/TOC appointment with Dr. Charlett Blake.

## 2019-01-25 MED FILL — VYVANSE 20 MG CAPSULE: 20 | 30 days supply | Qty: 30 | Fill #0

## 2019-02-04 MED FILL — ADVAIR 100/50 DISKUS: 100-50 | 30 days supply | Qty: 60 | Fill #0

## 2019-02-28 MED FILL — VYVANSE 20 MG CAPSULE: 20 | 30 days supply | Qty: 30 | Fill #0

## 2019-03-11 MED FILL — ADVAIR 100/50 DISKUS: 100-50 | 30 days supply | Qty: 60 | Fill #0

## 2019-04-08 MED FILL — VYVANSE 20 MG CAPSULE: 20 | 30 days supply | Qty: 30 | Fill #0

## 2019-04-10 MED FILL — ADVAIR 100/50 DISKUS: 100-50 | 30 days supply | Qty: 60 | Fill #1

## 2019-04-27 DIAGNOSIS — Z20828 Contact with and (suspected) exposure to other viral communicable diseases: Secondary | ICD-10-CM | POA: Diagnosis not present

## 2019-05-08 MED FILL — VYVANSE 20 MG CAPSULE: 20 | 30 days supply | Qty: 30 | Fill #0

## 2019-05-13 MED FILL — ADVAIR 100/50 DISKUS: 100-50 | 30 days supply | Qty: 60 | Fill #0

## 2019-06-08 MED FILL — VYVANSE 20 MG CAPSULE: 20 | 30 days supply | Qty: 30 | Fill #0

## 2019-06-13 MED FILL — ADVAIR 100/50 DISKUS: 100-50 | 30 days supply | Qty: 60 | Fill #1

## 2019-06-16 ENCOUNTER — Ambulatory Visit: Payer: No Typology Code available for payment source | Attending: Internal Medicine

## 2019-06-16 DIAGNOSIS — Z23 Encounter for immunization: Secondary | ICD-10-CM | POA: Insufficient documentation

## 2019-06-16 NOTE — Progress Notes (Signed)
   Covid-19 Vaccination Clinic  Name:  Sara Klein    MRN: [id number] DOB: Jul 28, 1995  06/16/2019  [name] was observed post Covid-19 immunization for 15 minutes without incident. She was provided with Vaccine Information Sheet and instruction to access the V-Safe system.   [name] was instructed to call 911 with any severe reactions post vaccine: Marland Kitchen Difficulty breathing  . Swelling of face and throat  . A fast heartbeat  . A bad rash all over body  . Dizziness and weakness   Immunizations Administered    Name Date Dose VIS Date Route   Pfizer COVID-19 Vaccine 06/16/2019  5:19 PM 0.3 mL 03/22/2019 Intramuscular   Manufacturer: [employer], [employer]   Lot: [id number]   NDC: [address]-5

## 2019-07-09 MED FILL — VYVANSE 20 MG CAPSULE: 20 | 30 days supply | Qty: 30 | Fill #0

## 2019-07-15 MED FILL — ADVAIR 100/50 DISKUS: 100-50 | 30 days supply | Qty: 60 | Fill #2

## 2019-07-17 ENCOUNTER — Ambulatory Visit: Payer: Self-pay | Attending: Internal Medicine

## 2019-07-17 DIAGNOSIS — Z23 Encounter for immunization: Secondary | ICD-10-CM

## 2019-07-17 NOTE — Progress Notes (Signed)
   Covid-19 Vaccination Clinic  Name:  Sara Klein    MRN: [id number] DOB: [birth year]  07/17/2019  Ms. Sara Klein was observed post Covid-19 immunization for 15 minutes without incident. She was provided with Vaccine Information Sheet and instruction to access the V-Safe system.   Ms. Sara Klein was instructed to call 911 with any severe reactions post vaccine: Marland Kitchen Difficulty breathing  . Swelling of face and throat  . A fast heartbeat  . A bad rash all over body  . Dizziness and weakness   Immunizations Administered    Name Date Dose VIS Date Route   Pfizer COVID-19 Vaccine 07/17/2019  4:10 PM 0.3 mL 03/22/2019 Intramuscular   Manufacturer: [employer], [employer]   Lot: [id number]   NDC: [address]-8

## 2019-08-06 MED FILL — VYVANSE 20 MG CAPSULE: 20 | 30 days supply | Qty: 30 | Fill #0

## 2019-08-16 MED FILL — ADVAIR 100/50 DISKUS: 100-50 | 30 days supply | Qty: 60 | Fill #3

## 2019-09-07 MED FILL — VYVANSE 20 MG CAPSULE: 20 | 30 days supply | Qty: 30 | Fill #0

## 2019-09-13 MED FILL — ADVAIR 100/50 DISKUS: 100-50 | 30 days supply | Qty: 60 | Fill #4

## 2019-10-07 MED FILL — VYVANSE 20 MG CAPSULE: 20 | 30 days supply | Qty: 30 | Fill #0

## 2019-10-09 MED FILL — AMOXICILLIN 500 MG CAPSULE: 500 | 5 days supply | Qty: 15 | Fill #0

## 2019-10-19 MED FILL — ADVAIR 100/50 DISKUS: 100-50 | 30 days supply | Qty: 60 | Fill #5

## 2019-11-04 ENCOUNTER — Other Ambulatory Visit (HOSPITAL_COMMUNITY): Payer: Self-pay | Admitting: Allergy and Immunology

## 2019-11-07 DIAGNOSIS — F909 Attention-deficit hyperactivity disorder, unspecified type: Secondary | ICD-10-CM | POA: Diagnosis not present

## 2019-11-08 MED FILL — VYVANSE 30 MG CAPSULE: 30 | 90 days supply | Qty: 90 | Fill #0

## 2019-11-18 MED FILL — ADVAIR 100/50 DISKUS: 100-50 | 30 days supply | Qty: 60 | Fill #6

## 2019-12-20 MED FILL — ADVAIR 100/50 DISKUS: 100-50 | 30 days supply | Qty: 60 | Fill #0

## 2020-01-21 MED FILL — ADVAIR 100/50 DISKUS: 100-50 | 30 days supply | Qty: 60 | Fill #1

## 2020-02-04 ENCOUNTER — Other Ambulatory Visit (HOSPITAL_COMMUNITY): Payer: Self-pay | Admitting: Psychiatry

## 2020-02-08 MED FILL — VYVANSE 30 MG CAPSULE: 30 | 30 days supply | Qty: 30 | Fill #0

## 2020-02-14 ENCOUNTER — Other Ambulatory Visit (HOSPITAL_COMMUNITY): Payer: Self-pay | Admitting: Psychiatry

## 2020-02-22 MED FILL — ADVAIR 100/50 DISKUS: 100-50 | 30 days supply | Qty: 60 | Fill #2

## 2020-03-09 MED FILL — VYVANSE 30 MG CAPSULE: 30 | 30 days supply | Qty: 30 | Fill #0

## 2020-03-13 ENCOUNTER — Other Ambulatory Visit (HOSPITAL_COMMUNITY): Payer: Self-pay | Admitting: Psychiatry

## 2020-03-16 ENCOUNTER — Other Ambulatory Visit (HOSPITAL_COMMUNITY): Payer: Self-pay | Admitting: Allergy and Immunology

## 2020-03-17 MED FILL — ADVAIR 100/50 DISKUS: 100-50 | 90 days supply | Qty: 180 | Fill #0

## 2020-04-10 MED FILL — VYVANSE 30 MG CAPSULE: 30 | 30 days supply | Qty: 30 | Fill #0

## 2020-04-13 ENCOUNTER — Other Ambulatory Visit (HOSPITAL_COMMUNITY): Payer: Self-pay | Admitting: Psychiatry

## 2020-05-11 MED FILL — VYVANSE 30 MG CAPSULE: 30 | 30 days supply | Qty: 30 | Fill #0

## 2020-05-14 ENCOUNTER — Other Ambulatory Visit (HOSPITAL_COMMUNITY): Payer: Self-pay | Admitting: Psychiatry

## 2020-06-11 MED FILL — VYVANSE 30 MG CAPSULE: 30 | 30 days supply | Qty: 30 | Fill #0

## 2020-07-13 ENCOUNTER — Other Ambulatory Visit (HOSPITAL_COMMUNITY): Payer: Self-pay

## 2020-07-13 MED ORDER — LISDEXAMFETAMINE DIMESYLATE 30 MG PO CAPS
30.0000 mg | ORAL_CAPSULE | Freq: Every morning | ORAL | 0 refills | Status: DC
  Filled 2020-07-13: qty 30, 30d supply, fill #0

## 2020-07-27 ENCOUNTER — Other Ambulatory Visit (HOSPITAL_COMMUNITY): Payer: Self-pay

## 2020-07-27 MED FILL — Fluticasone-Salmeterol Aer Powder BA 100-50 MCG/ACT: RESPIRATORY_TRACT | 60 days supply | Qty: 120 | Fill #0 | Status: AC

## 2020-07-28 ENCOUNTER — Other Ambulatory Visit (HOSPITAL_COMMUNITY): Payer: Self-pay

## 2020-07-28 MED ORDER — PREDNISONE 10 MG PO TABS
10.0000 mg | ORAL_TABLET | Freq: Every day | ORAL | 0 refills | Status: AC
  Filled 2020-07-28: qty 7, 7d supply, fill #0

## 2020-07-28 MED ORDER — AZITHROMYCIN 250 MG PO TABS
ORAL_TABLET | ORAL | 0 refills | Status: AC
  Filled 2020-07-28: qty 6, 5d supply, fill #0

## 2020-08-11 ENCOUNTER — Other Ambulatory Visit (HOSPITAL_COMMUNITY): Payer: Self-pay

## 2020-08-11 MED ORDER — LISDEXAMFETAMINE DIMESYLATE 30 MG PO CAPS
30.0000 mg | ORAL_CAPSULE | Freq: Every morning | ORAL | 0 refills | Status: DC
  Filled 2020-08-11: qty 30, 30d supply, fill #0

## 2020-09-09 ENCOUNTER — Other Ambulatory Visit (HOSPITAL_COMMUNITY): Payer: Self-pay

## 2020-09-09 MED ORDER — LISDEXAMFETAMINE DIMESYLATE 30 MG PO CAPS
30.0000 mg | ORAL_CAPSULE | Freq: Every day | ORAL | 0 refills | Status: DC
  Filled 2020-09-09: qty 30, 30d supply, fill #0

## 2020-09-25 ENCOUNTER — Other Ambulatory Visit (HOSPITAL_COMMUNITY): Payer: Self-pay

## 2020-09-25 MED ORDER — VYVANSE 30 MG PO CAPS
ORAL_CAPSULE | ORAL | 0 refills | Status: AC
  Filled 2020-10-10: qty 30, 30d supply, fill #0

## 2020-09-26 ENCOUNTER — Other Ambulatory Visit (HOSPITAL_COMMUNITY): Payer: Self-pay

## 2020-09-26 MED ORDER — FLUTICASONE-SALMETEROL 100-50 MCG/ACT IN AEPB
INHALATION_SPRAY | RESPIRATORY_TRACT | 2 refills | Status: DC
  Filled 2020-09-26: qty 180, 90d supply, fill #0

## 2020-10-10 ENCOUNTER — Other Ambulatory Visit (HOSPITAL_COMMUNITY): Payer: Self-pay

## 2020-10-13 ENCOUNTER — Other Ambulatory Visit (HOSPITAL_COMMUNITY): Payer: Self-pay

## 2020-11-13 ENCOUNTER — Other Ambulatory Visit (HOSPITAL_COMMUNITY): Payer: Self-pay

## 2020-11-13 MED ORDER — VYVANSE 30 MG PO CAPS
ORAL_CAPSULE | ORAL | 0 refills | Status: DC
  Filled 2020-11-13: qty 30, 30d supply, fill #0

## 2020-12-12 ENCOUNTER — Other Ambulatory Visit (HOSPITAL_COMMUNITY): Payer: Self-pay

## 2020-12-12 MED ORDER — VYVANSE 30 MG PO CAPS
ORAL_CAPSULE | ORAL | 0 refills | Status: DC
  Filled 2020-12-12: qty 30, 30d supply, fill #0

## 2020-12-21 ENCOUNTER — Other Ambulatory Visit (HOSPITAL_COMMUNITY): Payer: Self-pay

## 2020-12-21 MED ORDER — PIMECROLIMUS 1 % EX CREA
TOPICAL_CREAM | CUTANEOUS | 0 refills | Status: AC
  Filled 2020-12-21: qty 30, 10d supply, fill #0
  Filled 2020-12-29 – 2021-03-30 (×3): qty 30, 30d supply, fill #0

## 2020-12-23 ENCOUNTER — Other Ambulatory Visit (HOSPITAL_COMMUNITY): Payer: Self-pay

## 2020-12-28 ENCOUNTER — Other Ambulatory Visit (HOSPITAL_COMMUNITY): Payer: Self-pay

## 2020-12-29 ENCOUNTER — Other Ambulatory Visit (HOSPITAL_COMMUNITY): Payer: Self-pay

## 2020-12-29 MED ORDER — FLUTICASONE-SALMETEROL 100-50 MCG/ACT IN AEPB
INHALATION_SPRAY | RESPIRATORY_TRACT | 3 refills | Status: DC
  Filled 2020-12-29: qty 60, 30d supply, fill #0
  Filled 2021-02-01: qty 60, 30d supply, fill #1
  Filled 2021-03-03: qty 60, 30d supply, fill #2
  Filled 2021-10-26: qty 60, 30d supply, fill #3

## 2020-12-30 ENCOUNTER — Other Ambulatory Visit (HOSPITAL_COMMUNITY): Payer: Self-pay

## 2021-01-01 ENCOUNTER — Other Ambulatory Visit (HOSPITAL_COMMUNITY): Payer: Self-pay

## 2021-01-06 ENCOUNTER — Other Ambulatory Visit (HOSPITAL_COMMUNITY): Payer: Self-pay

## 2021-01-08 ENCOUNTER — Other Ambulatory Visit (HOSPITAL_COMMUNITY): Payer: Self-pay

## 2021-01-09 ENCOUNTER — Other Ambulatory Visit (HOSPITAL_COMMUNITY): Payer: Self-pay

## 2021-01-11 ENCOUNTER — Other Ambulatory Visit (HOSPITAL_COMMUNITY): Payer: Self-pay

## 2021-01-12 ENCOUNTER — Other Ambulatory Visit (HOSPITAL_COMMUNITY): Payer: Self-pay

## 2021-01-12 MED ORDER — VYVANSE 30 MG PO CAPS
ORAL_CAPSULE | ORAL | 0 refills | Status: DC
  Filled 2021-01-12: qty 30, 30d supply, fill #0

## 2021-01-14 ENCOUNTER — Other Ambulatory Visit (HOSPITAL_COMMUNITY): Payer: Self-pay

## 2021-01-15 ENCOUNTER — Other Ambulatory Visit (HOSPITAL_COMMUNITY): Payer: Self-pay

## 2021-01-21 ENCOUNTER — Other Ambulatory Visit (HOSPITAL_COMMUNITY): Payer: Self-pay

## 2021-01-25 ENCOUNTER — Other Ambulatory Visit (HOSPITAL_COMMUNITY): Payer: Self-pay

## 2021-02-01 ENCOUNTER — Other Ambulatory Visit (HOSPITAL_COMMUNITY): Payer: Self-pay

## 2021-02-11 ENCOUNTER — Other Ambulatory Visit (HOSPITAL_COMMUNITY): Payer: Self-pay

## 2021-02-11 MED ORDER — VYVANSE 30 MG PO CAPS
ORAL_CAPSULE | ORAL | 0 refills | Status: DC
  Filled 2021-02-11: qty 30, 30d supply, fill #0

## 2021-03-03 ENCOUNTER — Other Ambulatory Visit (HOSPITAL_COMMUNITY): Payer: Self-pay

## 2021-03-15 ENCOUNTER — Other Ambulatory Visit (HOSPITAL_COMMUNITY): Payer: Self-pay

## 2021-03-15 MED ORDER — VYVANSE 30 MG PO CAPS
ORAL_CAPSULE | ORAL | 0 refills | Status: DC
  Filled 2021-03-15: qty 30, 30d supply, fill #0

## 2021-03-17 ENCOUNTER — Other Ambulatory Visit (HOSPITAL_COMMUNITY): Payer: Self-pay

## 2021-03-17 MED ORDER — ALBUTEROL SULFATE HFA 108 (90 BASE) MCG/ACT IN AERS
INHALATION_SPRAY | RESPIRATORY_TRACT | 0 refills | Status: AC
  Filled 2021-03-17: qty 18, 16d supply, fill #0
  Filled 2021-07-23: qty 6.7, 17d supply, fill #0

## 2021-03-17 MED ORDER — FLUTICASONE-SALMETEROL 100-50 MCG/ACT IN AEPB
INHALATION_SPRAY | RESPIRATORY_TRACT | 5 refills | Status: DC
  Filled 2021-03-17: qty 60, 30d supply, fill #0
  Filled 2021-05-18: qty 60, 30d supply, fill #1
  Filled 2021-06-18: qty 60, 30d supply, fill #2
  Filled 2021-07-23: qty 60, 30d supply, fill #3
  Filled 2021-08-25: qty 60, 30d supply, fill #4
  Filled 2021-09-24: qty 60, 30d supply, fill #5

## 2021-03-17 MED ORDER — TRIAMCINOLONE ACETONIDE 0.1 % EX OINT
TOPICAL_OINTMENT | CUTANEOUS | 3 refills | Status: AC
Start: 2021-03-17 — End: ?
  Filled 2021-03-17: qty 60, 15d supply, fill #0
  Filled 2021-03-30: qty 60, 30d supply, fill #0

## 2021-03-25 ENCOUNTER — Other Ambulatory Visit (HOSPITAL_COMMUNITY): Payer: Self-pay

## 2021-03-26 ENCOUNTER — Other Ambulatory Visit (HOSPITAL_COMMUNITY): Payer: Self-pay

## 2021-03-30 ENCOUNTER — Other Ambulatory Visit (HOSPITAL_COMMUNITY): Payer: Self-pay

## 2021-04-15 ENCOUNTER — Other Ambulatory Visit (HOSPITAL_COMMUNITY): Payer: Self-pay

## 2021-04-15 MED ORDER — VYVANSE 30 MG PO CAPS
ORAL_CAPSULE | ORAL | 0 refills | Status: DC
Start: 2021-04-14 — End: 2021-05-26
  Filled 2021-04-15 – 2021-04-26 (×3): qty 30, 30d supply, fill #0

## 2021-04-16 ENCOUNTER — Other Ambulatory Visit (HOSPITAL_COMMUNITY): Payer: Self-pay

## 2021-04-23 ENCOUNTER — Other Ambulatory Visit (HOSPITAL_COMMUNITY): Payer: Self-pay

## 2021-04-24 ENCOUNTER — Other Ambulatory Visit (HOSPITAL_COMMUNITY): Payer: Self-pay

## 2021-04-26 ENCOUNTER — Other Ambulatory Visit (HOSPITAL_COMMUNITY): Payer: Self-pay

## 2021-05-18 ENCOUNTER — Other Ambulatory Visit (HOSPITAL_COMMUNITY): Payer: Self-pay

## 2021-05-26 ENCOUNTER — Other Ambulatory Visit (HOSPITAL_COMMUNITY): Payer: Self-pay

## 2021-05-26 MED ORDER — VYVANSE 30 MG PO CAPS
ORAL_CAPSULE | ORAL | 0 refills | Status: DC
  Filled 2021-05-26: qty 30, 30d supply, fill #0

## 2021-06-08 ENCOUNTER — Other Ambulatory Visit (HOSPITAL_COMMUNITY): Payer: Self-pay

## 2021-06-08 MED ORDER — DOXYCYCLINE HYCLATE 100 MG PO CAPS
ORAL_CAPSULE | ORAL | 0 refills | Status: AC
  Filled 2021-06-08: qty 28, 14d supply, fill #0

## 2021-06-08 MED ORDER — SPIRONOLACTONE 25 MG PO TABS
ORAL_TABLET | ORAL | 0 refills | Status: AC
  Filled 2021-06-08: qty 90, 90d supply, fill #0

## 2021-06-18 ENCOUNTER — Other Ambulatory Visit (HOSPITAL_COMMUNITY): Payer: Self-pay

## 2021-06-25 ENCOUNTER — Other Ambulatory Visit (HOSPITAL_COMMUNITY): Payer: Self-pay

## 2021-06-25 MED ORDER — VYVANSE 30 MG PO CAPS
ORAL_CAPSULE | ORAL | 0 refills | Status: DC
  Filled 2021-06-25: qty 30, 30d supply, fill #0

## 2021-07-23 ENCOUNTER — Other Ambulatory Visit (HOSPITAL_COMMUNITY): Payer: Self-pay

## 2021-07-26 ENCOUNTER — Other Ambulatory Visit (HOSPITAL_COMMUNITY): Payer: Self-pay

## 2021-07-26 MED ORDER — VYVANSE 30 MG PO CAPS
ORAL_CAPSULE | ORAL | 0 refills | Status: DC
  Filled 2021-07-26: qty 30, 30d supply, fill #0

## 2021-08-25 ENCOUNTER — Other Ambulatory Visit (HOSPITAL_COMMUNITY): Payer: Self-pay

## 2021-08-25 MED ORDER — VYVANSE 30 MG PO CAPS
ORAL_CAPSULE | ORAL | 0 refills | Status: DC
  Filled 2021-08-25: qty 30, 30d supply, fill #0

## 2021-09-23 ENCOUNTER — Other Ambulatory Visit (HOSPITAL_COMMUNITY): Payer: Self-pay

## 2021-09-23 MED ORDER — VYVANSE 30 MG PO CAPS
ORAL_CAPSULE | ORAL | 0 refills | Status: DC
  Filled 2021-09-23: qty 30, 30d supply, fill #0

## 2021-09-24 ENCOUNTER — Other Ambulatory Visit (HOSPITAL_COMMUNITY): Payer: Self-pay

## 2021-10-26 ENCOUNTER — Other Ambulatory Visit (HOSPITAL_COMMUNITY): Payer: Self-pay

## 2021-10-27 ENCOUNTER — Other Ambulatory Visit (HOSPITAL_COMMUNITY): Payer: Self-pay

## 2021-10-27 MED ORDER — VYVANSE 30 MG PO CAPS
ORAL_CAPSULE | ORAL | 0 refills | Status: DC
  Filled 2021-10-27: qty 30, 30d supply, fill #0

## 2021-11-02 ENCOUNTER — Other Ambulatory Visit (HOSPITAL_COMMUNITY): Payer: Self-pay

## 2021-11-04 ENCOUNTER — Other Ambulatory Visit (HOSPITAL_COMMUNITY): Payer: Self-pay

## 2021-11-29 ENCOUNTER — Other Ambulatory Visit (HOSPITAL_COMMUNITY): Payer: Self-pay

## 2021-11-29 MED ORDER — FLUTICASONE-SALMETEROL 100-50 MCG/ACT IN AEPB
INHALATION_SPRAY | RESPIRATORY_TRACT | 3 refills | Status: DC
  Filled 2021-11-29: qty 60, 30d supply, fill #0
  Filled 2022-01-03: qty 60, 30d supply, fill #1
  Filled 2022-02-07: qty 60, 30d supply, fill #2
  Filled 2022-03-12: qty 60, 30d supply, fill #3

## 2021-11-30 ENCOUNTER — Other Ambulatory Visit (HOSPITAL_COMMUNITY): Payer: Self-pay

## 2021-12-03 ENCOUNTER — Other Ambulatory Visit (HOSPITAL_COMMUNITY): Payer: Self-pay

## 2021-12-03 MED ORDER — VYVANSE 30 MG PO CAPS
ORAL_CAPSULE | ORAL | 0 refills | Status: DC
  Filled 2021-12-03: qty 30, 30d supply, fill #0

## 2022-01-03 ENCOUNTER — Other Ambulatory Visit (HOSPITAL_COMMUNITY): Payer: Self-pay

## 2022-01-03 MED ORDER — LISDEXAMFETAMINE DIMESYLATE 30 MG PO CAPS
30.0000 mg | ORAL_CAPSULE | ORAL | 0 refills | Status: DC
  Filled 2022-01-03: qty 30, 30d supply, fill #0

## 2022-01-04 ENCOUNTER — Other Ambulatory Visit (HOSPITAL_COMMUNITY): Payer: Self-pay

## 2022-02-03 ENCOUNTER — Other Ambulatory Visit (HOSPITAL_COMMUNITY): Payer: Self-pay

## 2022-02-03 MED ORDER — LISDEXAMFETAMINE DIMESYLATE 30 MG PO CAPS
30.0000 mg | ORAL_CAPSULE | ORAL | 0 refills | Status: AC
  Filled 2022-02-03: qty 30, 30d supply, fill #0

## 2022-02-07 ENCOUNTER — Other Ambulatory Visit (HOSPITAL_COMMUNITY): Payer: Self-pay

## 2022-03-04 ENCOUNTER — Other Ambulatory Visit (HOSPITAL_COMMUNITY): Payer: Self-pay

## 2022-03-04 MED ORDER — LISDEXAMFETAMINE DIMESYLATE 30 MG PO CAPS
30.0000 mg | ORAL_CAPSULE | Freq: Every day | ORAL | 0 refills | Status: AC | PRN
  Filled 2022-03-04: qty 30, 30d supply, fill #0

## 2022-03-12 ENCOUNTER — Other Ambulatory Visit (HOSPITAL_COMMUNITY): Payer: Self-pay

## 2022-04-06 ENCOUNTER — Other Ambulatory Visit (HOSPITAL_COMMUNITY): Payer: Self-pay

## 2022-04-06 MED ORDER — LISDEXAMFETAMINE DIMESYLATE 30 MG PO CAPS
30.0000 mg | ORAL_CAPSULE | Freq: Every morning | ORAL | 0 refills | Status: DC
  Filled 2022-04-06: qty 30, 30d supply, fill #0

## 2022-04-13 ENCOUNTER — Other Ambulatory Visit (HOSPITAL_COMMUNITY): Payer: Self-pay

## 2022-04-13 MED ORDER — FLUTICASONE-SALMETEROL 100-50 MCG/ACT IN AEPB
1.0000 | INHALATION_SPRAY | Freq: Two times a day (BID) | RESPIRATORY_TRACT | 4 refills | Status: AC
  Filled 2022-04-13: qty 60, 30d supply, fill #0
  Filled 2022-05-18: qty 60, 30d supply, fill #1
  Filled 2022-06-22: qty 60, 30d supply, fill #2
  Filled 2022-07-26: qty 60, 30d supply, fill #3

## 2022-05-05 ENCOUNTER — Other Ambulatory Visit (HOSPITAL_COMMUNITY): Payer: Self-pay

## 2022-05-05 MED ORDER — LISDEXAMFETAMINE DIMESYLATE 30 MG PO CAPS
ORAL_CAPSULE | ORAL | 0 refills | Status: AC
  Filled 2022-05-05: qty 30, 30d supply, fill #0

## 2022-05-06 ENCOUNTER — Other Ambulatory Visit (HOSPITAL_COMMUNITY): Payer: Self-pay

## 2022-05-07 ENCOUNTER — Other Ambulatory Visit (HOSPITAL_COMMUNITY): Payer: Self-pay

## 2022-05-09 ENCOUNTER — Other Ambulatory Visit (HOSPITAL_COMMUNITY): Payer: Self-pay

## 2022-05-18 ENCOUNTER — Other Ambulatory Visit (HOSPITAL_COMMUNITY): Payer: Self-pay

## 2022-06-07 ENCOUNTER — Other Ambulatory Visit (HOSPITAL_COMMUNITY): Payer: Self-pay

## 2022-06-07 MED ORDER — LISDEXAMFETAMINE DIMESYLATE 30 MG PO CAPS
30.0000 mg | ORAL_CAPSULE | Freq: Every day | ORAL | 0 refills | Status: AC | PRN
  Filled 2022-06-07: qty 30, 30d supply, fill #0

## 2022-06-08 ENCOUNTER — Other Ambulatory Visit (HOSPITAL_COMMUNITY): Payer: Self-pay

## 2022-06-13 ENCOUNTER — Other Ambulatory Visit: Payer: Self-pay

## 2022-06-13 ENCOUNTER — Other Ambulatory Visit (HOSPITAL_COMMUNITY): Payer: Self-pay

## 2022-06-22 ENCOUNTER — Other Ambulatory Visit (HOSPITAL_COMMUNITY): Payer: Self-pay

## 2022-07-13 ENCOUNTER — Other Ambulatory Visit (HOSPITAL_COMMUNITY): Payer: Self-pay

## 2022-07-13 MED ORDER — LISDEXAMFETAMINE DIMESYLATE 30 MG PO CAPS
ORAL_CAPSULE | ORAL | 0 refills | Status: DC
  Filled 2022-07-13: qty 30, 30d supply, fill #0

## 2022-07-26 ENCOUNTER — Other Ambulatory Visit (HOSPITAL_COMMUNITY): Payer: Self-pay

## 2022-08-12 ENCOUNTER — Other Ambulatory Visit (HOSPITAL_COMMUNITY): Payer: Self-pay

## 2022-08-12 MED ORDER — LISDEXAMFETAMINE DIMESYLATE 30 MG PO CAPS
30.0000 mg | ORAL_CAPSULE | Freq: Every morning | ORAL | 0 refills | Status: DC
  Filled 2022-08-12: qty 30, 30d supply, fill #0

## 2022-08-26 ENCOUNTER — Other Ambulatory Visit (HOSPITAL_COMMUNITY): Payer: Self-pay

## 2022-08-27 ENCOUNTER — Other Ambulatory Visit (HOSPITAL_COMMUNITY): Payer: Self-pay

## 2022-08-27 MED ORDER — FLUTICASONE-SALMETEROL 100-50 MCG/ACT IN AEPB
1.0000 | INHALATION_SPRAY | Freq: Two times a day (BID) | RESPIRATORY_TRACT | 4 refills | Status: AC
  Filled 2022-08-27: qty 60, 30d supply, fill #0
  Filled 2022-09-30: qty 60, 30d supply, fill #1
  Filled 2023-05-11: qty 60, 30d supply, fill #2
  Filled 2023-06-12: qty 60, 30d supply, fill #3
  Filled 2023-07-14: qty 60, 30d supply, fill #4

## 2022-09-15 ENCOUNTER — Other Ambulatory Visit (HOSPITAL_COMMUNITY): Payer: Self-pay

## 2022-09-15 MED ORDER — LISDEXAMFETAMINE DIMESYLATE 30 MG PO CAPS
30.0000 mg | ORAL_CAPSULE | Freq: Every morning | ORAL | 0 refills | Status: DC
  Filled 2022-09-15: qty 30, 30d supply, fill #0

## 2022-09-16 ENCOUNTER — Other Ambulatory Visit (HOSPITAL_COMMUNITY): Payer: Self-pay

## 2022-09-16 ENCOUNTER — Other Ambulatory Visit: Payer: Self-pay

## 2022-09-30 ENCOUNTER — Other Ambulatory Visit (HOSPITAL_COMMUNITY): Payer: Self-pay

## 2022-10-17 ENCOUNTER — Other Ambulatory Visit (HOSPITAL_COMMUNITY): Payer: Self-pay

## 2022-10-17 MED ORDER — ALBUTEROL SULFATE HFA 108 (90 BASE) MCG/ACT IN AERS
INHALATION_SPRAY | RESPIRATORY_TRACT | 0 refills | Status: AC
  Filled 2022-10-17: qty 6.7, 30d supply, fill #0

## 2022-10-17 MED ORDER — FLUTICASONE-SALMETEROL 100-50 MCG/ACT IN AEPB
1.0000 | INHALATION_SPRAY | Freq: Two times a day (BID) | RESPIRATORY_TRACT | 5 refills | Status: DC
  Filled 2022-10-17 – 2022-11-03 (×2): qty 60, 30d supply, fill #0
  Filled 2022-12-05: qty 60, 30d supply, fill #1
  Filled 2023-01-06: qty 60, 30d supply, fill #2
  Filled 2023-02-06: qty 60, 30d supply, fill #3
  Filled 2023-03-07: qty 60, 30d supply, fill #4
  Filled 2023-04-10: qty 60, 30d supply, fill #5

## 2022-10-17 MED ORDER — LISDEXAMFETAMINE DIMESYLATE 30 MG PO CAPS
30.0000 mg | ORAL_CAPSULE | Freq: Every morning | ORAL | 0 refills | Status: AC
  Filled 2022-10-17: qty 30, 30d supply, fill #0

## 2022-10-18 ENCOUNTER — Other Ambulatory Visit (HOSPITAL_COMMUNITY): Payer: Self-pay

## 2022-11-03 ENCOUNTER — Other Ambulatory Visit (HOSPITAL_COMMUNITY): Payer: Self-pay

## 2022-11-17 ENCOUNTER — Other Ambulatory Visit (HOSPITAL_COMMUNITY): Payer: Self-pay

## 2022-11-17 MED ORDER — LISDEXAMFETAMINE DIMESYLATE 30 MG PO CAPS
30.0000 mg | ORAL_CAPSULE | Freq: Every morning | ORAL | 0 refills | Status: DC
  Filled 2022-11-17: qty 30, 30d supply, fill #0

## 2022-11-18 ENCOUNTER — Other Ambulatory Visit: Payer: Self-pay

## 2022-11-18 ENCOUNTER — Other Ambulatory Visit (HOSPITAL_COMMUNITY): Payer: Self-pay

## 2022-12-05 ENCOUNTER — Other Ambulatory Visit (HOSPITAL_COMMUNITY): Payer: Self-pay

## 2022-12-16 ENCOUNTER — Other Ambulatory Visit (HOSPITAL_COMMUNITY): Payer: Self-pay

## 2022-12-16 MED ORDER — LISDEXAMFETAMINE DIMESYLATE 30 MG PO CAPS
ORAL_CAPSULE | ORAL | 0 refills | Status: DC
  Filled 2022-12-16: qty 30, 30d supply, fill #0

## 2023-01-06 ENCOUNTER — Other Ambulatory Visit (HOSPITAL_COMMUNITY): Payer: Self-pay

## 2023-01-18 ENCOUNTER — Other Ambulatory Visit (HOSPITAL_COMMUNITY): Payer: Self-pay

## 2023-01-18 MED ORDER — LISDEXAMFETAMINE DIMESYLATE 30 MG PO CAPS
30.0000 mg | ORAL_CAPSULE | Freq: Every morning | ORAL | 0 refills | Status: DC
Start: 2023-01-17 — End: 2023-02-15
  Filled 2023-01-18: qty 30, 30d supply, fill #0

## 2023-02-06 ENCOUNTER — Other Ambulatory Visit (HOSPITAL_COMMUNITY): Payer: Self-pay

## 2023-02-15 ENCOUNTER — Other Ambulatory Visit (HOSPITAL_COMMUNITY): Payer: Self-pay

## 2023-02-15 MED ORDER — LISDEXAMFETAMINE DIMESYLATE 30 MG PO CAPS
30.0000 mg | ORAL_CAPSULE | Freq: Every morning | ORAL | 0 refills | Status: DC
Start: 2023-02-15 — End: 2023-03-20
  Filled 2023-02-15: qty 30, 30d supply, fill #0

## 2023-03-07 ENCOUNTER — Other Ambulatory Visit (HOSPITAL_COMMUNITY): Payer: Self-pay

## 2023-03-08 ENCOUNTER — Other Ambulatory Visit (HOSPITAL_COMMUNITY): Payer: Self-pay

## 2023-03-20 ENCOUNTER — Other Ambulatory Visit (HOSPITAL_COMMUNITY): Payer: Self-pay

## 2023-03-20 MED ORDER — LISDEXAMFETAMINE DIMESYLATE 30 MG PO CAPS
30.0000 mg | ORAL_CAPSULE | Freq: Every morning | ORAL | 0 refills | Status: DC
  Filled 2023-03-20: qty 10, 10d supply, fill #0
  Filled 2023-03-20: qty 20, 20d supply, fill #0

## 2023-04-10 ENCOUNTER — Other Ambulatory Visit (HOSPITAL_COMMUNITY): Payer: Self-pay

## 2023-04-19 ENCOUNTER — Other Ambulatory Visit (HOSPITAL_COMMUNITY): Payer: Self-pay

## 2023-04-19 ENCOUNTER — Other Ambulatory Visit: Payer: Self-pay

## 2023-04-19 MED ORDER — LISDEXAMFETAMINE DIMESYLATE 30 MG PO CAPS
30.0000 mg | ORAL_CAPSULE | ORAL | 0 refills | Status: DC
  Filled 2023-04-19: qty 30, 30d supply, fill #0

## 2023-05-11 ENCOUNTER — Other Ambulatory Visit (HOSPITAL_COMMUNITY): Payer: Self-pay

## 2023-05-19 ENCOUNTER — Other Ambulatory Visit (HOSPITAL_COMMUNITY): Payer: Self-pay

## 2023-05-19 MED ORDER — LISDEXAMFETAMINE DIMESYLATE 30 MG PO CAPS
30.0000 mg | ORAL_CAPSULE | ORAL | 0 refills | Status: DC
  Filled 2023-05-19: qty 30, 30d supply, fill #0

## 2023-05-20 ENCOUNTER — Other Ambulatory Visit (HOSPITAL_COMMUNITY): Payer: Self-pay

## 2023-06-12 ENCOUNTER — Other Ambulatory Visit (HOSPITAL_COMMUNITY): Payer: Self-pay

## 2023-06-16 ENCOUNTER — Other Ambulatory Visit (HOSPITAL_COMMUNITY): Payer: Self-pay

## 2023-06-17 ENCOUNTER — Other Ambulatory Visit (HOSPITAL_COMMUNITY): Payer: Self-pay

## 2023-06-17 ENCOUNTER — Other Ambulatory Visit (HOSPITAL_BASED_OUTPATIENT_CLINIC_OR_DEPARTMENT_OTHER): Payer: Self-pay

## 2023-06-17 MED ORDER — LISDEXAMFETAMINE DIMESYLATE 30 MG PO CAPS
30.0000 mg | ORAL_CAPSULE | Freq: Every morning | ORAL | 0 refills | Status: DC
  Filled 2023-06-17: qty 30, 30d supply, fill #0

## 2023-07-14 ENCOUNTER — Other Ambulatory Visit (HOSPITAL_COMMUNITY): Payer: Self-pay

## 2023-07-18 ENCOUNTER — Other Ambulatory Visit (HOSPITAL_COMMUNITY): Payer: Self-pay

## 2023-07-18 MED ORDER — LISDEXAMFETAMINE DIMESYLATE 30 MG PO CAPS
30.0000 mg | ORAL_CAPSULE | Freq: Every morning | ORAL | 0 refills | Status: DC
  Filled 2023-07-18 – 2023-07-19 (×2): qty 30, 30d supply, fill #0

## 2023-07-19 ENCOUNTER — Other Ambulatory Visit (HOSPITAL_COMMUNITY): Payer: Self-pay

## 2023-08-09 ENCOUNTER — Other Ambulatory Visit (HOSPITAL_COMMUNITY): Payer: Self-pay

## 2023-08-09 MED ORDER — LISDEXAMFETAMINE DIMESYLATE 30 MG PO CAPS
30.0000 mg | ORAL_CAPSULE | Freq: Every morning | ORAL | 0 refills | Status: DC
Start: 2023-08-09 — End: 2023-09-18
  Filled 2023-08-09 – 2023-08-21 (×2): qty 30, 30d supply, fill #0

## 2023-08-15 ENCOUNTER — Other Ambulatory Visit (HOSPITAL_COMMUNITY): Payer: Self-pay

## 2023-08-16 ENCOUNTER — Other Ambulatory Visit (HOSPITAL_COMMUNITY): Payer: Self-pay

## 2023-08-16 MED ORDER — FLUTICASONE-SALMETEROL 100-50 MCG/ACT IN AEPB
1.0000 | INHALATION_SPRAY | Freq: Two times a day (BID) | RESPIRATORY_TRACT | 1 refills | Status: AC
  Filled 2023-08-16: qty 60, 30d supply, fill #0
  Filled 2023-11-22: qty 60, 30d supply, fill #1

## 2023-08-21 ENCOUNTER — Other Ambulatory Visit (HOSPITAL_COMMUNITY): Payer: Self-pay

## 2023-09-11 ENCOUNTER — Other Ambulatory Visit (HOSPITAL_COMMUNITY): Payer: Self-pay

## 2023-09-11 MED ORDER — FLUTICASONE PROPIONATE 50 MCG/ACT NA SUSP
1.0000 | Freq: Every day | NASAL | 0 refills | Status: AC
  Filled 2023-09-11: qty 16, 60d supply, fill #0

## 2023-09-11 MED ORDER — FLUTICASONE-SALMETEROL 100-50 MCG/ACT IN AEPB
1.0000 | INHALATION_SPRAY | Freq: Two times a day (BID) | RESPIRATORY_TRACT | 0 refills | Status: AC
  Filled 2023-09-11: qty 60, 30d supply, fill #0

## 2023-09-11 MED ORDER — FEXOFENADINE HCL 180 MG PO TABS
180.0000 mg | ORAL_TABLET | Freq: Every day | ORAL | 0 refills | Status: AC
  Filled 2023-09-11: qty 90, 90d supply, fill #0

## 2023-09-18 ENCOUNTER — Other Ambulatory Visit (HOSPITAL_COMMUNITY): Payer: Self-pay

## 2023-09-18 MED ORDER — LISDEXAMFETAMINE DIMESYLATE 30 MG PO CAPS
30.0000 mg | ORAL_CAPSULE | Freq: Every morning | ORAL | 0 refills | Status: DC
  Filled 2023-09-18: qty 30, 30d supply, fill #0

## 2023-09-19 ENCOUNTER — Other Ambulatory Visit (HOSPITAL_COMMUNITY): Payer: Self-pay

## 2023-10-12 ENCOUNTER — Other Ambulatory Visit (HOSPITAL_COMMUNITY): Payer: Self-pay

## 2023-10-12 MED ORDER — FLUTICASONE-SALMETEROL 100-50 MCG/ACT IN AEPB
1.0000 | INHALATION_SPRAY | Freq: Two times a day (BID) | RESPIRATORY_TRACT | 8 refills | Status: AC
  Filled 2023-10-12: qty 60, 30d supply, fill #0
  Filled 2024-01-25: qty 60, 30d supply, fill #1

## 2023-10-12 MED ORDER — ALBUTEROL SULFATE HFA 108 (90 BASE) MCG/ACT IN AERS
2.0000 | INHALATION_SPRAY | RESPIRATORY_TRACT | 5 refills | Status: AC | PRN
  Filled 2023-10-12: qty 6.7, 30d supply, fill #0

## 2023-10-20 ENCOUNTER — Other Ambulatory Visit (HOSPITAL_COMMUNITY): Payer: Self-pay

## 2023-10-20 MED ORDER — LISDEXAMFETAMINE DIMESYLATE 30 MG PO CAPS
30.0000 mg | ORAL_CAPSULE | Freq: Every morning | ORAL | 0 refills | Status: DC
  Filled 2023-10-20: qty 30, 30d supply, fill #0

## 2023-11-20 ENCOUNTER — Other Ambulatory Visit (HOSPITAL_COMMUNITY): Payer: Self-pay

## 2023-11-20 MED ORDER — LISDEXAMFETAMINE DIMESYLATE 30 MG PO CAPS
30.0000 mg | ORAL_CAPSULE | Freq: Every morning | ORAL | 0 refills | Status: DC
  Filled 2023-11-20: qty 30, 30d supply, fill #0

## 2023-11-22 ENCOUNTER — Other Ambulatory Visit (HOSPITAL_COMMUNITY): Payer: Self-pay

## 2023-12-20 ENCOUNTER — Other Ambulatory Visit: Payer: Self-pay

## 2023-12-20 ENCOUNTER — Other Ambulatory Visit (HOSPITAL_COMMUNITY): Payer: Self-pay

## 2023-12-20 MED ORDER — LISDEXAMFETAMINE DIMESYLATE 30 MG PO CAPS
30.0000 mg | ORAL_CAPSULE | Freq: Every morning | ORAL | 0 refills | Status: DC
  Filled 2023-12-20: qty 30, 30d supply, fill #0

## 2023-12-27 ENCOUNTER — Other Ambulatory Visit (HOSPITAL_COMMUNITY): Payer: Self-pay

## 2023-12-27 MED ORDER — FLUTICASONE-SALMETEROL 100-50 MCG/ACT IN AEPB
1.0000 | INHALATION_SPRAY | Freq: Two times a day (BID) | RESPIRATORY_TRACT | 8 refills | Status: AC
  Filled 2023-12-27: qty 60, 30d supply, fill #0
  Filled 2024-02-27: qty 60, 30d supply, fill #1
  Filled 2024-04-01: qty 60, 30d supply, fill #2
  Filled 2024-05-02: qty 60, 30d supply, fill #3

## 2024-01-19 ENCOUNTER — Other Ambulatory Visit (HOSPITAL_COMMUNITY): Payer: Self-pay

## 2024-01-19 MED ORDER — LISDEXAMFETAMINE DIMESYLATE 30 MG PO CAPS
ORAL_CAPSULE | ORAL | 0 refills | Status: DC
  Filled 2024-01-19: qty 30, 30d supply, fill #0

## 2024-01-25 ENCOUNTER — Other Ambulatory Visit (HOSPITAL_COMMUNITY): Payer: Self-pay

## 2024-02-14 ENCOUNTER — Other Ambulatory Visit (HOSPITAL_COMMUNITY): Payer: Self-pay

## 2024-02-14 MED ORDER — LISDEXAMFETAMINE DIMESYLATE 30 MG PO CAPS
30.0000 mg | ORAL_CAPSULE | Freq: Every morning | ORAL | 0 refills | Status: DC
  Filled 2024-02-16: qty 30, 30d supply, fill #0

## 2024-02-16 ENCOUNTER — Other Ambulatory Visit (HOSPITAL_COMMUNITY): Payer: Self-pay

## 2024-02-27 ENCOUNTER — Other Ambulatory Visit (HOSPITAL_COMMUNITY): Payer: Self-pay

## 2024-03-18 ENCOUNTER — Other Ambulatory Visit (HOSPITAL_COMMUNITY): Payer: Self-pay

## 2024-03-18 MED ORDER — LISDEXAMFETAMINE DIMESYLATE 30 MG PO CAPS
30.0000 mg | ORAL_CAPSULE | Freq: Every morning | ORAL | 0 refills | Status: DC
  Filled 2024-03-18 (×2): qty 30, 30d supply, fill #0

## 2024-04-01 ENCOUNTER — Other Ambulatory Visit (HOSPITAL_COMMUNITY): Payer: Self-pay

## 2024-04-17 ENCOUNTER — Other Ambulatory Visit (HOSPITAL_COMMUNITY): Payer: Self-pay

## 2024-04-17 MED ORDER — LISDEXAMFETAMINE DIMESYLATE 30 MG PO CAPS
30.0000 mg | ORAL_CAPSULE | Freq: Every morning | ORAL | 0 refills | Status: DC
  Filled 2024-04-17: qty 30, 30d supply, fill #0

## 2024-05-02 ENCOUNTER — Other Ambulatory Visit (HOSPITAL_COMMUNITY): Payer: Self-pay

## 2024-05-17 ENCOUNTER — Other Ambulatory Visit (HOSPITAL_COMMUNITY): Payer: Self-pay

## 2024-05-17 MED ORDER — LISDEXAMFETAMINE DIMESYLATE 30 MG PO CAPS
30.0000 mg | ORAL_CAPSULE | Freq: Every morning | ORAL | 0 refills | Status: AC
  Filled 2024-05-17: qty 30, 30d supply, fill #0
